# Patient Record
Sex: Male | Born: 1985 | Race: Black or African American | Hispanic: No | Marital: Married | Smoking: Never smoker
Health system: Southern US, Community
[De-identification: ages and names within clinical notes are randomized; demographics above are authoritative.]

## PROBLEM LIST (undated history)

## (undated) DIAGNOSIS — S62509A Fracture of unspecified phalanx of unspecified thumb, initial encounter for closed fracture: Secondary | ICD-10-CM

## (undated) DIAGNOSIS — D72819 Decreased white blood cell count, unspecified: Secondary | ICD-10-CM

## (undated) DIAGNOSIS — R030 Elevated blood-pressure reading, without diagnosis of hypertension: Secondary | ICD-10-CM

## (undated) DIAGNOSIS — T7840XA Allergy, unspecified, initial encounter: Secondary | ICD-10-CM

## (undated) DIAGNOSIS — E559 Vitamin D deficiency, unspecified: Secondary | ICD-10-CM

## (undated) DIAGNOSIS — L819 Disorder of pigmentation, unspecified: Secondary | ICD-10-CM

## (undated) DIAGNOSIS — F419 Anxiety disorder, unspecified: Secondary | ICD-10-CM

## (undated) DIAGNOSIS — E785 Hyperlipidemia, unspecified: Secondary | ICD-10-CM

## (undated) DIAGNOSIS — R519 Headache, unspecified: Secondary | ICD-10-CM

## (undated) DIAGNOSIS — G4700 Insomnia, unspecified: Secondary | ICD-10-CM

## (undated) DIAGNOSIS — D729 Disorder of white blood cells, unspecified: Secondary | ICD-10-CM

## (undated) DIAGNOSIS — J45909 Unspecified asthma, uncomplicated: Secondary | ICD-10-CM

## (undated) DIAGNOSIS — J849 Interstitial pulmonary disease, unspecified: Secondary | ICD-10-CM

## (undated) DIAGNOSIS — G43909 Migraine, unspecified, not intractable, without status migrainosus: Secondary | ICD-10-CM

## (undated) DIAGNOSIS — R51 Headache: Secondary | ICD-10-CM

## (undated) HISTORY — DX: Unspecified asthma, uncomplicated: J45.909

## (undated) HISTORY — DX: Migraine, unspecified, not intractable, without status migrainosus: G43.909

## (undated) HISTORY — DX: Anxiety disorder, unspecified: F41.9

## (undated) HISTORY — DX: Disorder of white blood cells, unspecified: D72.9

## (undated) HISTORY — DX: Hyperlipidemia, unspecified: E78.5

## (undated) HISTORY — DX: Interstitial pulmonary disease, unspecified: J84.9

## (undated) HISTORY — DX: Allergy, unspecified, initial encounter: T78.40XA

## (undated) HISTORY — DX: Decreased white blood cell count, unspecified: D72.819

## (undated) HISTORY — DX: Fracture of unspecified phalanx of unspecified thumb, initial encounter for closed fracture: S62.509A

## (undated) HISTORY — PX: OTHER SURGICAL HISTORY: SHX169

## (undated) HISTORY — DX: Disorder of pigmentation, unspecified: L81.9

## (undated) HISTORY — DX: Insomnia, unspecified: G47.00

## (undated) HISTORY — DX: Elevated blood-pressure reading, without diagnosis of hypertension: R03.0

## (undated) HISTORY — DX: Vitamin D deficiency, unspecified: E55.9

## (undated) HISTORY — DX: Headache, unspecified: R51.9

## (undated) HISTORY — DX: Headache: R51

---
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
## 2007-01-06 ENCOUNTER — Ambulatory Visit: Payer: Self-pay | Admitting: Family Medicine

## 2007-01-06 DIAGNOSIS — R030 Elevated blood-pressure reading, without diagnosis of hypertension: Secondary | ICD-10-CM | POA: Insufficient documentation

## 2007-01-06 DIAGNOSIS — J309 Allergic rhinitis, unspecified: Secondary | ICD-10-CM | POA: Insufficient documentation

## 2007-01-14 ENCOUNTER — Encounter (INDEPENDENT_AMBULATORY_CARE_PROVIDER_SITE_OTHER): Payer: Self-pay | Admitting: Family Medicine

## 2007-04-28 ENCOUNTER — Ambulatory Visit: Payer: Self-pay | Admitting: Family Medicine

## 2007-04-28 ENCOUNTER — Telehealth (INDEPENDENT_AMBULATORY_CARE_PROVIDER_SITE_OTHER): Payer: Self-pay | Admitting: *Deleted

## 2007-04-28 ENCOUNTER — Encounter: Payer: Self-pay | Admitting: Family Medicine

## 2007-04-28 DIAGNOSIS — J029 Acute pharyngitis, unspecified: Secondary | ICD-10-CM | POA: Insufficient documentation

## 2007-04-28 LAB — CONVERTED CEMR LAB: Rapid Strep: NEGATIVE

## 2007-09-19 ENCOUNTER — Emergency Department (HOSPITAL_COMMUNITY): Admission: EM | Admit: 2007-09-19 | Discharge: 2007-09-19 | Payer: Self-pay | Admitting: Emergency Medicine

## 2008-05-21 ENCOUNTER — Encounter (INDEPENDENT_AMBULATORY_CARE_PROVIDER_SITE_OTHER): Payer: Self-pay | Admitting: Family Medicine

## 2008-05-26 ENCOUNTER — Encounter (INDEPENDENT_AMBULATORY_CARE_PROVIDER_SITE_OTHER): Payer: Self-pay | Admitting: Family Medicine

## 2008-06-21 ENCOUNTER — Emergency Department (HOSPITAL_COMMUNITY): Admission: EM | Admit: 2008-06-21 | Discharge: 2008-06-21 | Payer: Self-pay | Admitting: Emergency Medicine

## 2010-10-15 ENCOUNTER — Encounter: Payer: Self-pay | Admitting: Family Medicine

## 2011-02-09 NOTE — Consult Note (Signed)
NAME:  Paul Silva, Paul Silva                          ACCOUNT NO.:  [id number]   MEDICAL RECORD NO.:  [id number]                   PATIENT TYPE:  EMS   LOCATION:  [facility]                                 FACILITY:  [facility]   PHYSICIAN:  Karol T. Lazarus Salines, M.D.              DATE OF BIRTH:  1986-03-31   DATE OF CONSULTATION:  DATE OF DISCHARGE:  11/20/2003                                   CONSULTATION   CHIEF COMPLAINT:  Left sore throat.   HISTORY OF PRESENT ILLNESS:  A 25 year old black male who came down with a  mild sore throat and what he thought was some left-sided earache four to  five days ago.  Yesterday, it became substantially worse with pain with  swallowing, pain in his neck, pain in his ear.  No muffled hearing or  draining from the ear.  No trismus or breathing difficulty.  He has been  able to drink at least some fluids.  He has not had recurrent problems in  the past from either tonsillitis or strep throat.  He is not diabetic or  otherwise immune compromised.  He has had no treatments thus far.   PAST MEDICAL HISTORY:  No known allergies.  No current active medical  conditions.   SOCIAL HISTORY:  He is in high school.   FAMILY HISTORY:  Noncontributory.   REVIEW OF SYSTEMS:  Normocephalic.   PHYSICAL EXAMINATION:  GENERAL:  This is a robust, adolescent black male,  moderate ______ but no significant hot potato voice.  He is not warm to  touch.  MENTAL STATUS:  Seems appropriate.  HEENT:  Ears are clear.  Anterior nose is clear.  Oral cavity is moist and  pink and in good repair.  He has erythematous bulging of the left tonsil and  soft palate which is indurated and tender.  No active drainage or exudate is  noted.  Soft palate is otherwise normal.  Did not examine nasopharynx or  hypopharynx.  NECK:  With some tender, indistinct adenopathy in the left jugulodigastric  region.   IMPRESSION:  Left peritonsillar abscess, early.   PLAN:  I discussed this with the patient  and the family.  Informed consent  was obtained.  I began with 2 mg of IV morphine and 2 mg of IV Versed slow  push. Cetacaine topical spray.  I infiltrated 10 cc of 1% Xylocaine with  1:100,000 epinephrine using a 22 gauge spinal needle in stages.  Finally, a  2 cm crescent incision was made over the superior pole of the left tonsil  and carried down to a mid posterior abscess pocket which was more widely  opened with a tonsil hemostat, and 3-4 cc of foul-smelling frank pus was  evacuated.  The patient is fully awake.  We will have him gargle with saline  and peroxide to clear out the material.  I have given him prescriptions for  amoxicillin  liquid for three days, 1 gram t.i.d. followed by amoxicillin pills 500 mg  t.i.d. for seven days further.  Also, Tylenol with codeine liquid for pain  relief.  I will see him back in my office 7-10 days in [place] as this  is more convenient for them, if he is doing nicely; sooner as needed.  The  patient and family understand and agree.                                               Gloris Manchester. Lazarus Salines, M.D.    KTW/MEDQ  D:  11/20/2003  T:  11/20/2003  Job:  [id number]   cc:   Nicoletta Dress. Colon Branch, M.D.  [address] [place]  [place] [address]  Fax: [phone]

## 2011-06-25 LAB — STREP A DNA PROBE: Group A Strep Probe: NEGATIVE

## 2011-06-25 LAB — RAPID STREP SCREEN (MED CTR MEBANE ONLY): Streptococcus, Group A Screen (Direct): NEGATIVE

## 2015-02-15 ENCOUNTER — Other Ambulatory Visit: Payer: Self-pay | Admitting: Medical

## 2015-02-15 DIAGNOSIS — R1900 Intra-abdominal and pelvic swelling, mass and lump, unspecified site: Secondary | ICD-10-CM

## 2015-02-22 ENCOUNTER — Ambulatory Visit: Payer: BLUE CROSS/BLUE SHIELD

## 2015-02-24 ENCOUNTER — Ambulatory Visit
Admission: RE | Admit: 2015-02-24 | Discharge: 2015-02-24 | Disposition: A | Discharge status: Home / Self Care | Payer: BLUE CROSS/BLUE SHIELD | Source: Ambulatory Visit | Attending: Medical | Admitting: Medical

## 2015-02-24 DIAGNOSIS — R1905 Periumbilic swelling, mass or lump: Secondary | ICD-10-CM | POA: Diagnosis present

## 2015-02-24 DIAGNOSIS — K439 Ventral hernia without obstruction or gangrene: Secondary | ICD-10-CM | POA: Insufficient documentation

## 2015-02-24 DIAGNOSIS — R1900 Intra-abdominal and pelvic swelling, mass and lump, unspecified site: Secondary | ICD-10-CM

## 2015-02-28 ENCOUNTER — Encounter: Payer: Self-pay | Admitting: *Deleted

## 2015-03-09 ENCOUNTER — Ambulatory Visit (INDEPENDENT_AMBULATORY_CARE_PROVIDER_SITE_OTHER): Payer: BLUE CROSS/BLUE SHIELD | Admitting: General Surgery

## 2015-03-09 ENCOUNTER — Encounter: Payer: Self-pay | Admitting: General Surgery

## 2015-03-09 VITALS — BP 142/82 | HR 58 | Resp 12 | Ht 71.0 in | Wt 288.0 lb

## 2015-03-09 DIAGNOSIS — K429 Umbilical hernia without obstruction or gangrene: Secondary | ICD-10-CM

## 2015-03-09 NOTE — Progress Notes (Signed)
Patient ID: Paul Silva, male   DOB: [birth year], 29 y.o.   MRN: [id number]  Chief Complaint  Patient presents with  . Umbilical Hernia    HPI Paul Silva is a 28 y.o. male here today for a evaluation of a umbilical hernia. He states he has noticed it for about one month ago. He states his work vest rides over it and irritates the area some. He does lift weights and has noticed it then as well.   HPI  Past Medical History  Diagnosis Date  . Fracture of thumb     History reviewed. No pertinent past surgical history.  History reviewed. No pertinent family history.  Social History History  Substance Use Topics  . Smoking status: Never Smoker   . Smokeless tobacco: Never Used  . Alcohol Use: No    No Known Allergies  No current outpatient prescriptions on file.   No current facility-administered medications for this visit.    Review of Systems Review of Systems  Constitutional: Negative.   Respiratory: Negative.   Cardiovascular: Negative.     Blood pressure 142/82, pulse 58, resp. rate 12, height 5\' 11"  (1.803 m), weight 288 lb (130.636 kg).  Physical Exam Physical Exam  Constitutional: He is oriented to person, place, and time. He appears well-developed and well-nourished.  Eyes: Conjunctivae are normal. No scleral icterus.  Neck: Neck supple.  Cardiovascular: Normal rate, regular rhythm and normal heart sounds.   Pulmonary/Chest: Effort normal and breath sounds normal.  Abdominal: Soft. Normal appearance. There is no tenderness. A hernia is present.    Lymphadenopathy:    He has no cervical adenopathy.  Neurological: He is alert and oriented to person, place, and time.  Skin: Skin is warm and dry.    Data Reviewed Office notes.  Assessment    Paraumbilical hernia, reducible. Minimally symptomatic.     Plan    Discussed hernia repair. Discussed procedure, risks, and benefits of surgery. Discussed possible use of mesh. Patient  agreeable.  Patient will notify the office when he desires to proceed with surgery.      PCP:  Renea Ee 03/10/2015, 8:49 AM

## 2015-03-09 NOTE — Patient Instructions (Addendum)
The patient is aware to call back for any questions or concerns. Hernia Repair with Laparoscope A hernia occurs when an internal organ pushes out through a weak spot in the belly (abdominal) wall muscles. Hernias most commonly occur in the groin and around the navel. Hernias can also occur through a cut by the surgeon (incision) after an abdominal operation. A hernia may be caused by:  Lifting heavy objects.  Prolonged coughing.  Straining to move your bowels. Hernias can often be pushed back into place (reduced). Most hernias tend to get worse over time. Problems occur when abdominal contents get stuck in the opening and the blood supply is blocked or impaired (incarcerated hernia). Because of these risks, you require surgery to repair the hernia. Your hernia will be repaired using a laparoscope. Laparoscopic surgery is a type of minimally invasive surgery. It does not involve making a typical surgical cut (incision) in the skin. A laparoscope is a telescope-like rod and lens system. It is usually connected to a video camera and a light source so your caregiver can clearly see the operative area. The instruments are inserted through  to  inch (5 mm or 10 mm) openings in the skin at specific locations. A working and viewing space is created by blowing a small amount of carbon dioxide gas into the abdominal cavity. The abdomen is essentially blown up like a balloon (insufflated). This elevates the abdominal wall above the internal organs like a dome. The carbon dioxide gas is common to the human body and can be absorbed by tissue and removed by the respiratory system. Once the repair is completed, the small incisions will be closed with either stitches (sutures) or staples (just like a paper stapler only this staple holds the skin together). LET YOUR CAREGIVERS KNOW ABOUT:  Allergies.  Medications taken including herbs, eye drops, over the counter medications, and creams.  Use of steroids (by mouth  or creams).  Previous problems with anesthetics or Novocaine.  Possibility of pregnancy, if this applies.  History of blood clots (thrombophlebitis).  History of bleeding or blood problems.  Previous surgery.  Other health problems. BEFORE THE PROCEDURE  Laparoscopy can be done either in a hospital or out-patient clinic. You may be given a mild sedative to help you relax before the procedure. Once in the operating room, you will be given a general anesthesia to make you sleep (unless you and your caregiver choose a different anesthetic).  AFTER THE PROCEDURE  After the procedure you will be watched in a recovery area. Depending on what type of hernia was repaired, you might be admitted to the hospital or you might go home the same day. With this procedure you may have less pain and scarring. This usually results in a quicker recovery and less risk of infection. HOME CARE INSTRUCTIONS   Bed rest is not required. You may continue your normal activities but avoid heavy lifting (more than 10 pounds) or straining.  Cough gently. If you are a smoker it is best to stop, as even the best hernia repair can break down with the continual strain of coughing.  Avoid driving until given the OK by your surgeon.  There are no dietary restrictions unless given otherwise.  TAKE ALL MEDICATIONS AS DIRECTED.  Only take over-the-counter or prescription medicines for pain, discomfort, or fever as directed by your caregiver. SEEK MEDICAL CARE IF:   There is increasing abdominal pain or pain in your incisions.  There is more bleeding from incisions, other  than minimal spotting.  You feel light headed or faint.  You develop an unexplained fever, chills, and/or an oral temperature above 102 F (38.9 C).  You have redness, swelling, or increasing pain in the wound.  Pus coming from wound.  A foul smell coming from the wound or dressings. SEEK IMMEDIATE MEDICAL CARE IF:   You develop a  rash.  You have difficulty breathing.  You have any allergic problems. MAKE SURE YOU:   Understand these instructions.  Will watch your condition.  Will get help right away if you are not doing well or get worse. Document Released: 09/10/2005 Document Revised: 12/03/2011 Document Reviewed: 08/10/2009 [facility] Patient Information 2015 [place], [place]. This information is not intended to replace advice given to you by your health care provider. Make sure you discuss any questions you have with your health care provider.  Patient will notify the office when he desires to proceed with surgery.

## 2015-03-10 ENCOUNTER — Encounter: Payer: Self-pay | Admitting: General Surgery

## 2015-08-08 IMAGING — US US ABDOMEN LIMITED
1 series · 14 of 25 positions shown · non-contrast
Comparison: None.

CLINICAL DATA: Three weeks of palpable mass above the umbilicus.

EXAM:
LIMITED ABDOMINAL ULTRASOUND

[Series 1: us abdomen limited · 0.30mm/px · 31 acquisitions, 14 frames shown]
[im 1/31]
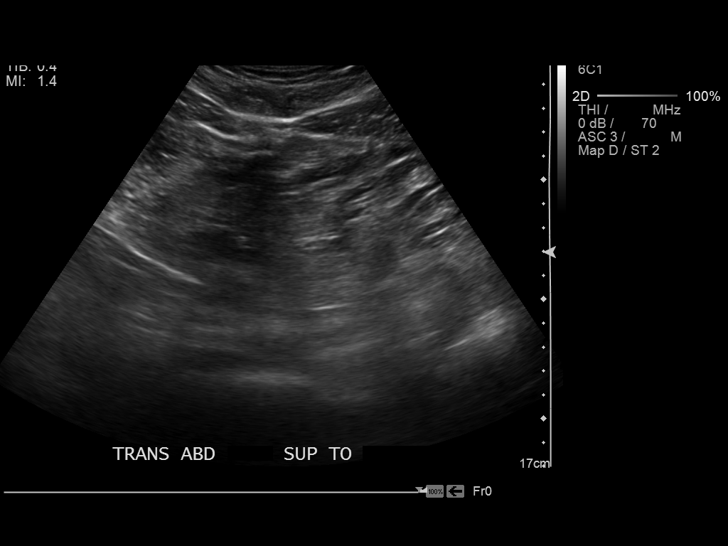
[im 3/31]
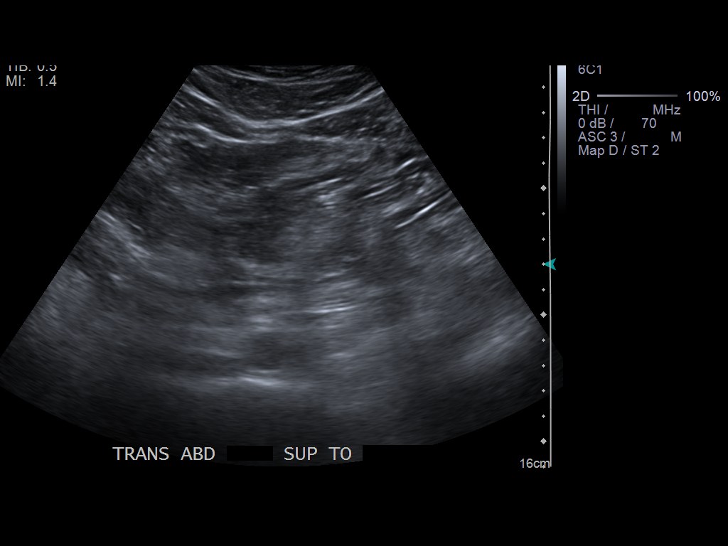
[im 6/31]
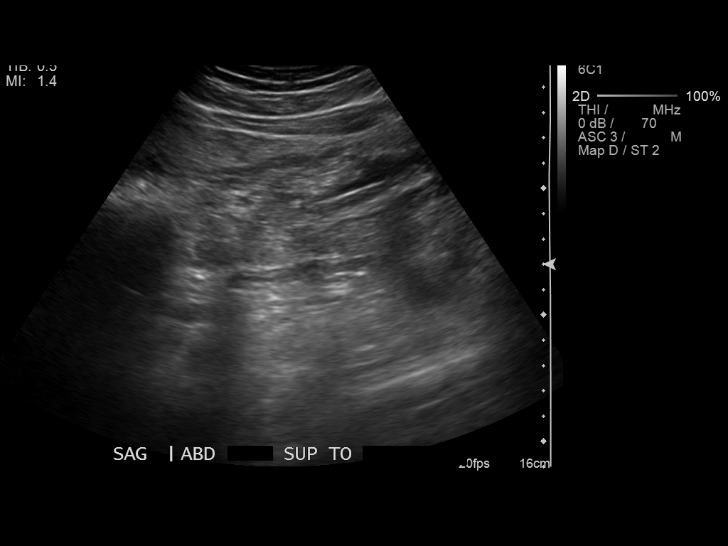
[im 8/31]
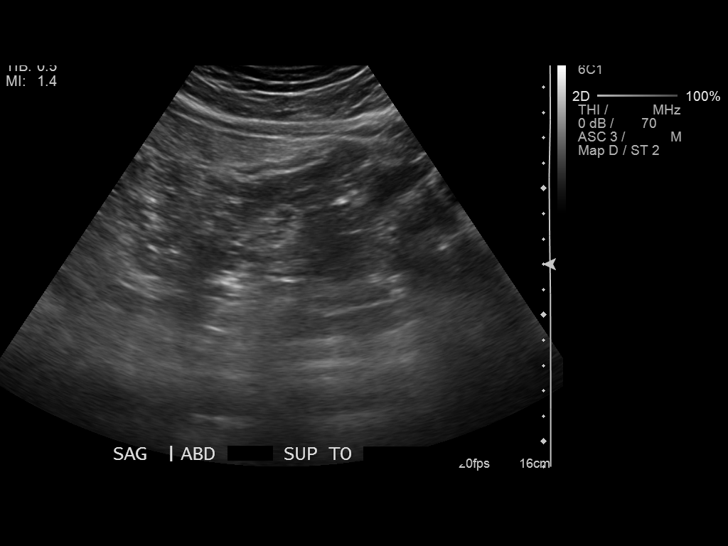
[im 11/31]
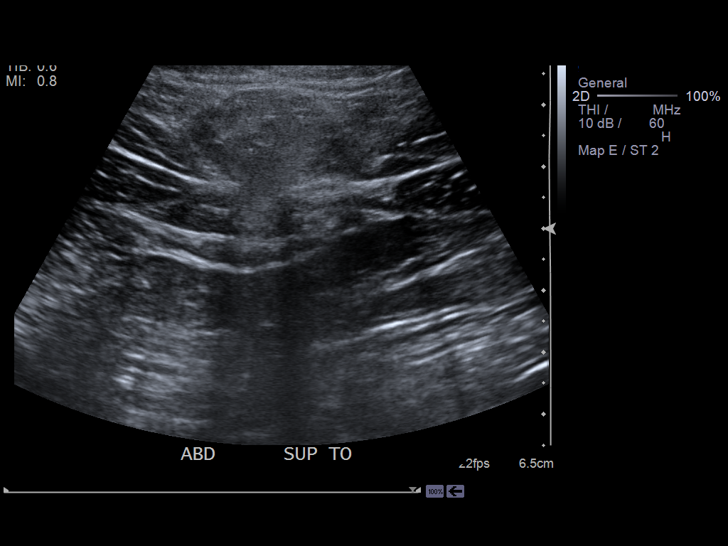
[im 12/31]
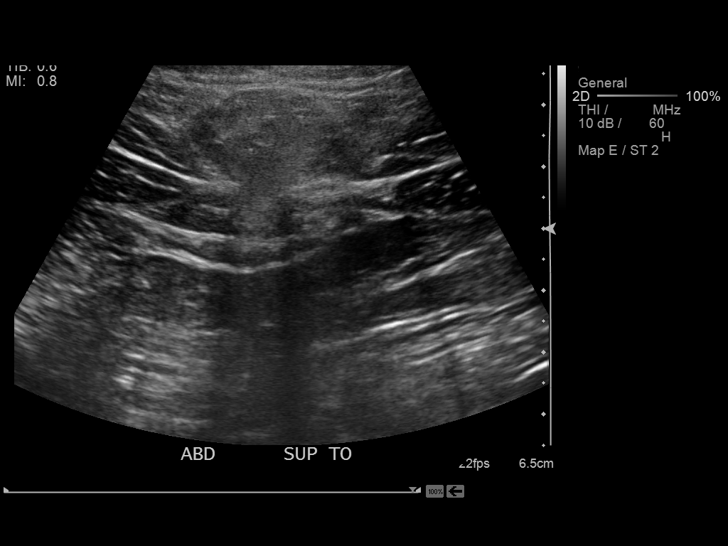
[im 14/31]
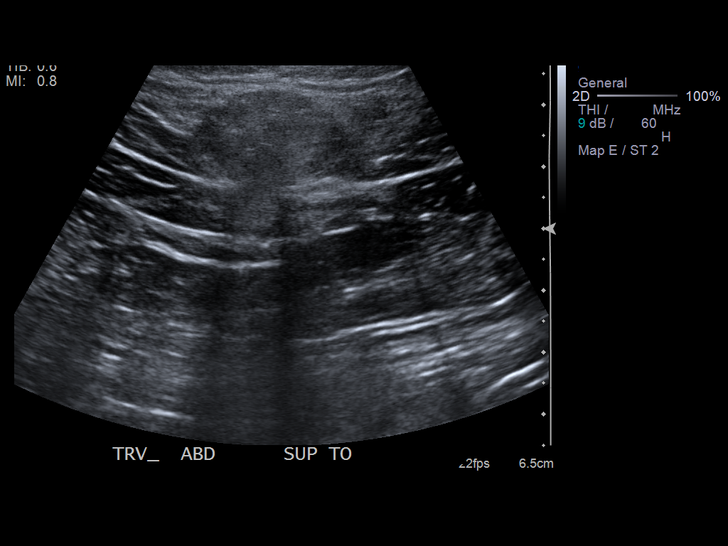
[im 17/31]
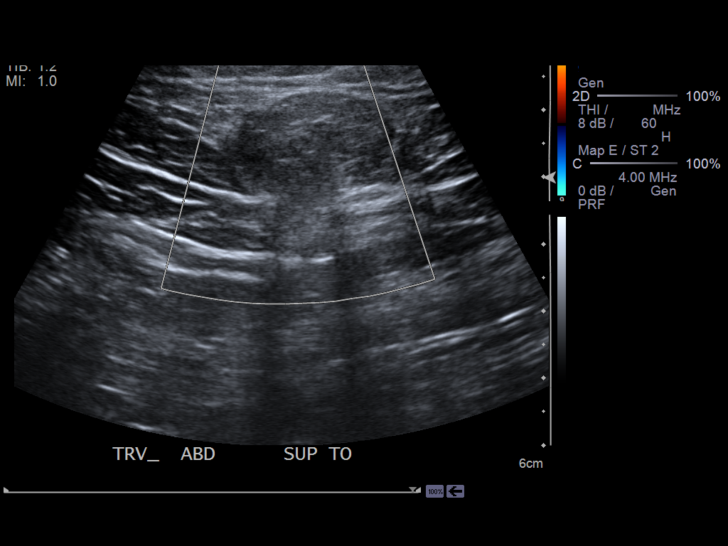
[im 19/31]
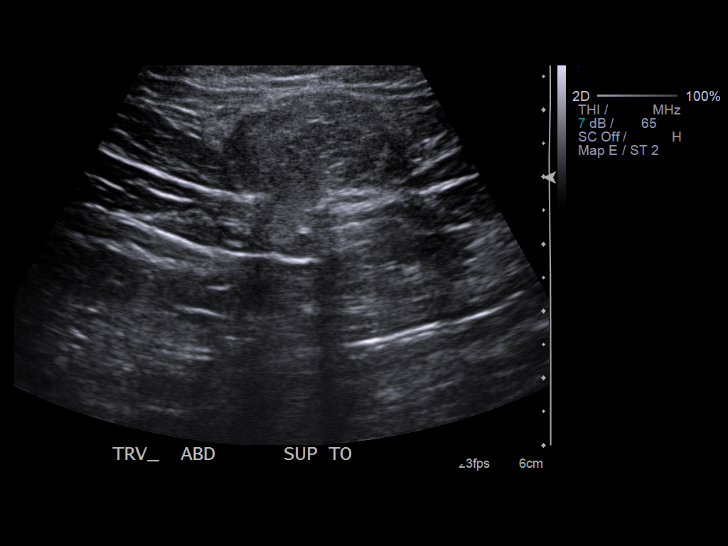
[im 21/31]
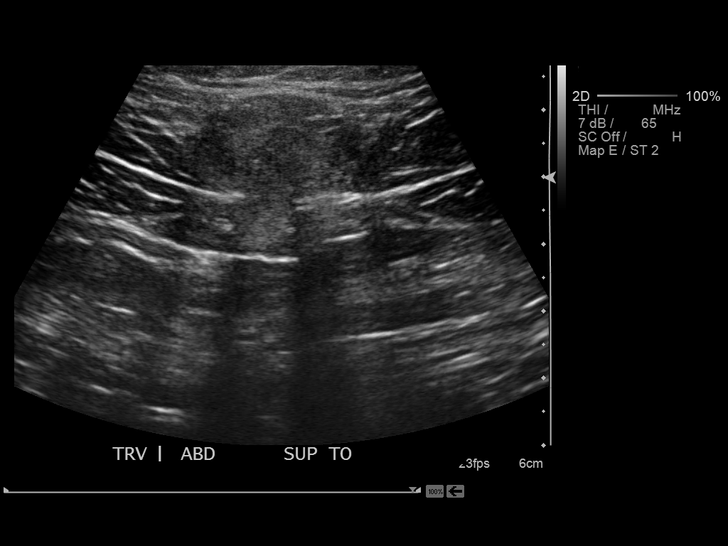
[im 23/31]
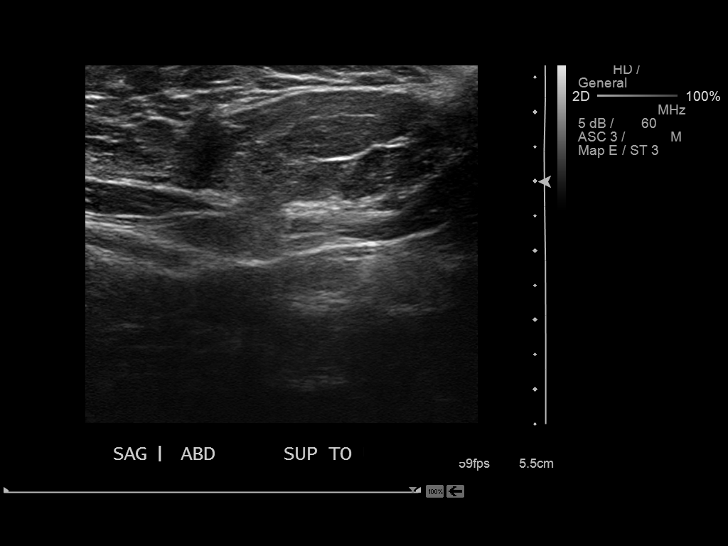
[im 26/31]
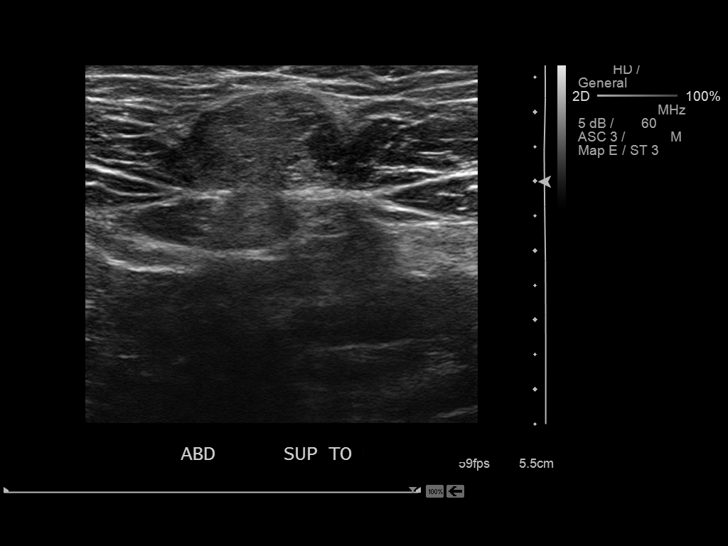
[im 28/31]
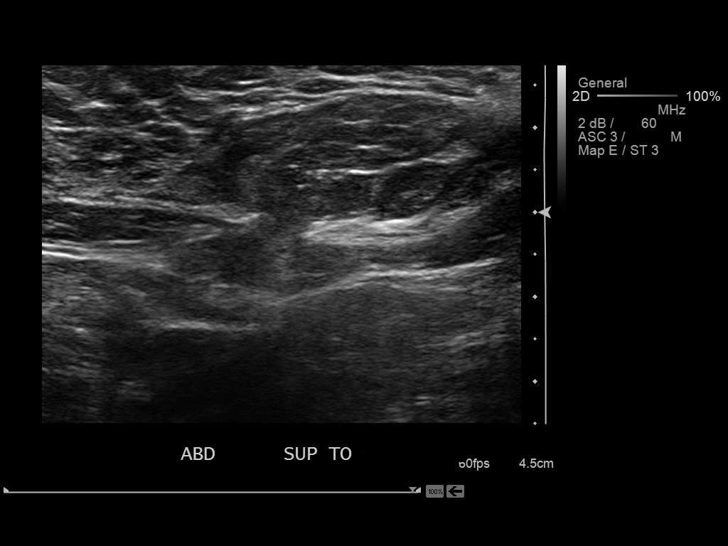
[im 31/31]
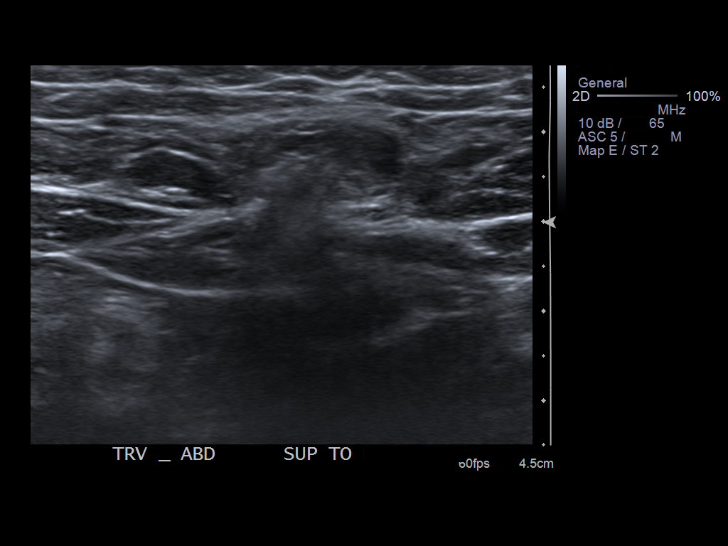

[14 of 25 positions shown; findings below may reference images not displayed]

FINDINGS: These soft tissues of the abdominal wall were evaluated above the
emboli kiss in the area of clinical concern. In this region there
appears to be a defect in the deep fascia through which omental has
entered. A small loop of peristalsing but nondistended bowel is
present within it.
IMPRESSION: There is a small supraumbilical abdominal wall hernia containing
omental fat and bowel.

## 2017-04-18 ENCOUNTER — Ambulatory Visit: Payer: BLUE CROSS/BLUE SHIELD | Admitting: Family Medicine

## 2017-04-30 ENCOUNTER — Ambulatory Visit (INDEPENDENT_AMBULATORY_CARE_PROVIDER_SITE_OTHER): Payer: Managed Care, Other (non HMO) | Admitting: Family

## 2017-04-30 ENCOUNTER — Ambulatory Visit (INDEPENDENT_AMBULATORY_CARE_PROVIDER_SITE_OTHER): Payer: Managed Care, Other (non HMO)

## 2017-04-30 ENCOUNTER — Ambulatory Visit: Payer: BLUE CROSS/BLUE SHIELD | Admitting: Family Medicine

## 2017-04-30 ENCOUNTER — Encounter: Payer: Self-pay | Admitting: Family

## 2017-04-30 VITALS — BP 128/88 | HR 67 | Temp 98.4°F | Ht 71.25 in | Wt 264.0 lb

## 2017-04-30 DIAGNOSIS — G4700 Insomnia, unspecified: Secondary | ICD-10-CM | POA: Diagnosis not present

## 2017-04-30 DIAGNOSIS — R05 Cough: Secondary | ICD-10-CM

## 2017-04-30 DIAGNOSIS — R059 Cough, unspecified: Secondary | ICD-10-CM | POA: Insufficient documentation

## 2017-04-30 MED ORDER — TRAZODONE HCL 50 MG PO TABS: 50.0000 mg | ORAL_TABLET | Freq: Every day | ORAL | 1 refills | Status: DC

## 2017-04-30 MED ORDER — BUDESONIDE-FORMOTEROL FUMARATE 80-4.5 MCG/ACT IN AERO: 2.0000 | INHALATION_SPRAY | Freq: Two times a day (BID) | RESPIRATORY_TRACT | 3 refills | Status: DC

## 2017-04-30 NOTE — Progress Notes (Signed)
Pre visit review using our clinic review tool, if applicable. No additional management support is needed unless otherwise documented below in the visit note. 

## 2017-04-30 NOTE — Patient Instructions (Addendum)
Trial steriod inhaler- suspect asthma  Trial zantac over twice a day.  Trial of trazodone  Follow up in 2 months

## 2017-04-30 NOTE — Progress Notes (Signed)
Subjective:    Patient ID: Paul Silva, male    DOB: [birth year], 31 y.o.   MRN: [id number]  CC: Paul Silva is a 31 y.o. male who presents today to establish care.    HPI: Trouble sleeping- averages 3 hours per night, takes a nap. works rotating shift. No anxiety or depression.  Excercises daily. 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 Tried melatonin, zquil, benadryl with no relief.   Asthma- dry cough x 10 months,  told he had 'asthma' in the past 10 months. 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 No problems with exercise. Tried inhaler. No wheezing, sob, cp. Tried claritin, zyrtec didn't help cough.  No eczema, drug/food allergies. Brother had asthma. Non smoker.   No epigastric burning, belching, foul taste in mouth.   Complains of heat rash from time to time from body armour for work and would like work note.           HISTORY:  Past Medical History:  Diagnosis Date  . Asthma   . Fracture of thumb   . Frequent headaches   . Migraines    History reviewed. No pertinent surgical history. Family History  Problem Relation Age of Onset  . Hypertension Father   . Diabetes Father     Allergies: Patient has no known allergies. No current outpatient prescriptions on file prior to visit.   No current facility-administered medications on file prior to visit.     Social History  Substance Use Topics  . Smoking status: Never Smoker  . Smokeless tobacco: Never Used  . Alcohol use 0.0 oz/week    Review of Systems  Constitutional: Negative for chills and fever.  HENT: Negative for congestion, ear pain, rhinorrhea, sinus pressure and sore throat.   Respiratory: Positive for cough. Negative for shortness of breath and wheezing.   Cardiovascular: Negative for chest pain and palpitations.  Gastrointestinal: Negative for diarrhea, nausea and vomiting.  Genitourinary: Negative for dysuria.  Musculoskeletal:  Negative for myalgias.  Skin: Negative for rash.  Neurological: Negative for headaches.  Hematological: Negative for adenopathy.  Psychiatric/Behavioral: Positive for sleep disturbance.      Objective:    BP 128/88   Pulse 67   Temp 98.4 F (36.9 C) (Oral)   Ht 5' 11.25" (1.81 m)   Wt 264 lb (119.7 kg)   SpO2 98%   BMI 36.56 kg/m  BP Readings from Last 3 Encounters:  04/30/17 128/88  03/09/15 (!) 142/82  04/28/07 128/87   Wt Readings from Last 3 Encounters:  04/30/17 264 lb (119.7 kg)  03/09/15 288 lb (130.6 kg)  04/28/07 203 lb (92.1 kg) (93 %, Z= 1.51)*   * Growth percentiles are based on CDC 2-20 Years data.    Physical Exam  Constitutional: He appears well-developed and well-nourished.  Cardiovascular: Regular rhythm and normal heart sounds.   Pulmonary/Chest: Effort normal and breath sounds normal. No respiratory distress. He has no wheezes. He has no rhonchi. He has no rales.  Neurological: He is alert.  Skin: Skin is warm and dry.  Psychiatric: He has a normal mood and affect. His speech is normal and behavior is normal.  Vitals reviewed.      Assessment & Plan:   Problem List Items Addressed This Visit      Other   Insomnia    Suspect shift disordered sleep as patient rotates day, night. Trial of trazodone" bedtime" (this may change  based on shifts). Follow up in 2-3 months, sooner if needed.       Relevant Medications   traZODone (DESYREL) 50 MG tablet   Cough - Primary    Afebrile today. In no acute respiratory distress. Nonlabored in speech. During office visit, several times patient had dry cough. Jointly agreed we would pursue chest x-ray to ensure no underlying pathology. Trial of Symbicort as symptoms triggered by smoke , mowing grass likely indicate asthma is underlying etiology. Alternately considering GERD, trial of zantac otc advised. Discussed formal evaluation pulmonology however we will await chest x-ray.      Relevant Orders   DG Chest  2 View (Completed)       I am having Mr. Maker start on traZODone. I am also having him maintain his PROAIR HFA.   Meds ordered this encounter  Medications  . PROAIR HFA 108 (90 Base) MCG/ACT inhaler  . traZODone (DESYREL) 50 MG tablet    Sig: Take 1 tablet (50 mg total) by mouth at bedtime.    Dispense:  90 tablet    Refill:  1    Order Specific Question:   Supervising Provider    Answer:   Deborra Medina L [[id number]]  . DISCONTD: budesonide-formoterol (SYMBICORT) 80-4.5 MCG/ACT inhaler    Sig: Inhale 2 puffs into the lungs 2 (two) times daily.    Dispense:  1 Inhaler    Refill:  3    Order Specific Question:   Supervising Provider    Answer:   Crecencio Mc [[id number]]    Return precautions given.   Risks, benefits, and alternatives of the medications and treatment plan prescribed today were discussed, and patient expressed understanding.   Education regarding symptom management and diagnosis given to patient on AVS.  Continue to follow with Burnard Hawthorne, FNP for routine health maintenance.   Paul Silva and I agreed with plan.   Mable Paris, FNP

## 2017-05-01 ENCOUNTER — Telehealth: Payer: Self-pay

## 2017-05-01 ENCOUNTER — Encounter: Payer: Self-pay | Admitting: Family

## 2017-05-01 ENCOUNTER — Telehealth: Payer: Self-pay | Admitting: Family

## 2017-05-01 DIAGNOSIS — R059 Cough, unspecified: Secondary | ICD-10-CM

## 2017-05-01 DIAGNOSIS — R05 Cough: Secondary | ICD-10-CM

## 2017-05-01 NOTE — Assessment & Plan Note (Signed)
Suspect shift disordered sleep as patient rotates day, night. Trial of trazodone" bedtime" (this may change based on shifts). Follow up in 2-3 months, sooner if needed.

## 2017-05-01 NOTE — Assessment & Plan Note (Addendum)
Afebrile today. In no acute respiratory distress. Nonlabored in speech. During office visit, several times patient had dry cough. Jointly agreed we would pursue chest x-ray to ensure no underlying pathology. Trial of Symbicort as symptoms triggered by smoke , mowing grass likely indicate asthma is underlying etiology. Alternately considering GERD, trial of zantac otc advised. Discussed formal evaluation pulmonology however we will await chest x-ray.

## 2017-05-01 NOTE — Telephone Encounter (Signed)
Patient was informed of results.  Patient understood and no questions, comments, or concerns at this time.  

## 2017-05-01 NOTE — Telephone Encounter (Signed)
Call pt-  CXR shows increased interstitial markings in lungs which I would like to be evaluated by pulmonology.  Please hold on using symbicort inhaler at this time until seen by pulmonology  We will call you with an appointment

## 2017-05-01 NOTE — Telephone Encounter (Signed)
patient called asking if we can add a date time frame on the letter we have given him for not wearing his body armour.

## 2017-05-02 NOTE — Telephone Encounter (Signed)
Patient has been informed.

## 2017-05-02 NOTE — Telephone Encounter (Signed)
Call pt-I really don't feel comfortable adding a date/time frame as there are situations for safety he would need his vest.  He will have to work out those details with his boss/manager.

## 2017-05-14 ENCOUNTER — Encounter: Payer: Self-pay | Admitting: Family

## 2017-05-14 ENCOUNTER — Encounter: Payer: Self-pay | Admitting: Internal Medicine

## 2017-05-14 ENCOUNTER — Ambulatory Visit (INDEPENDENT_AMBULATORY_CARE_PROVIDER_SITE_OTHER): Payer: Managed Care, Other (non HMO) | Admitting: Family

## 2017-05-14 ENCOUNTER — Telehealth: Payer: Self-pay | Admitting: Family

## 2017-05-14 ENCOUNTER — Ambulatory Visit (INDEPENDENT_AMBULATORY_CARE_PROVIDER_SITE_OTHER): Payer: Managed Care, Other (non HMO) | Admitting: Internal Medicine

## 2017-05-14 VITALS — BP 136/82 | HR 82 | Temp 98.6°F | Resp 14 | Wt 255.0 lb

## 2017-05-14 DIAGNOSIS — R21 Rash and other nonspecific skin eruption: Secondary | ICD-10-CM | POA: Diagnosis not present

## 2017-05-14 DIAGNOSIS — J849 Interstitial pulmonary disease, unspecified: Secondary | ICD-10-CM

## 2017-05-14 MED ORDER — TRIAMCINOLONE 0.1 % CREAM:EUCERIN CREAM 1:1: 1.0000 "application " | TOPICAL_CREAM | Freq: Two times a day (BID) | CUTANEOUS | 1 refills | Status: DC

## 2017-05-14 NOTE — Patient Instructions (Signed)
Rash consistent with eczema.  Trial of topical steriod. Use sparingly as can cause discoloration of skin  Let me know if doesn't improve   Eczema Eczema, also called atopic dermatitis, is a skin disorder that causes inflammation of the skin. It causes a red rash and dry, scaly skin. The skin becomes very itchy. Eczema is generally worse during the cooler winter months and often improves with the warmth of summer. Eczema usually starts showing signs in infancy. Some children outgrow eczema, but it may last through adulthood. What are the causes? The exact cause of eczema is not known, but it appears to run in families. People with eczema often have a family history of eczema, allergies, asthma, or hay fever. Eczema is not contagious. Flare-ups of the condition may be caused by:  Contact with something you are sensitive or allergic to.  Stress.  What are the signs or symptoms?  Dry, scaly skin.  Red, itchy rash.  Itchiness. This may occur before the skin rash and may be very intense. How is this diagnosed? The diagnosis of eczema is usually made based on symptoms and medical history. How is this treated? Eczema cannot be cured, but symptoms usually can be controlled with treatment and other strategies. A treatment plan might include:  Controlling the itching and scratching. ? Use over-the-counter antihistamines as directed for itching. This is especially useful at night when the itching tends to be worse. ? Use over-the-counter steroid creams as directed for itching. ? Avoid scratching. Scratching makes the rash and itching worse. It may also result in a skin infection (impetigo) due to a break in the skin caused by scratching.  Keeping the skin well moisturized with creams every day. This will seal in moisture and help prevent dryness. Lotions that contain alcohol and water should be avoided because they can dry the skin.  Limiting exposure to things that you are sensitive or  allergic to (allergens).  Recognizing situations that cause stress.  Developing a plan to manage stress.  Follow these instructions at home:  Only take over-the-counter or prescription medicines as directed by your health care provider.  Do not use anything on the skin without checking with your health care provider.  Keep baths or showers short (5 minutes) in warm (not hot) water. Use mild cleansers for bathing. These should be unscented. You may add nonperfumed bath oil to the bath water. It is best to avoid soap and bubble bath.  Immediately after a bath or shower, when the skin is still damp, apply a moisturizing ointment to the entire body. This ointment should be a petroleum ointment. This will seal in moisture and help prevent dryness. The thicker the ointment, the better. These should be unscented.  Keep fingernails cut short. Children with eczema may need to wear soft gloves or mittens at night after applying an ointment.  Dress in clothes made of cotton or cotton blends. Dress lightly, because heat increases itching.  A child with eczema should stay away from anyone with fever blisters or cold sores. The virus that causes fever blisters (herpes simplex) can cause a serious skin infection in children with eczema. Contact a health care provider if:  Your itching interferes with sleep.  Your rash gets worse or is not better within 1 week after starting treatment.  You see pus or soft yellow scabs in the rash area.  You have a fever.  You have a rash flare-up after contact with someone who has fever blisters. This information is  not intended to replace advice given to you by your health care provider. Make sure you discuss any questions you have with your health care provider. Document Released: 09/07/2000 Document Revised: 02/16/2016 Document Reviewed: 04/13/2013 Elsevier Interactive Patient Education  2017 [employer].

## 2017-05-14 NOTE — Progress Notes (Signed)
Pre-visit discussion using our clinic review tool. No additional management support is needed unless otherwise documented below in the visit note.  

## 2017-05-14 NOTE — Progress Notes (Signed)
[facility] Consultation      Assessment and Plan:  Chronic cough, possible asthma. -Continue Symbicort.  Mild interstitial changes on CT chest. May be indicative of mild or early sarcoidosis. -Check serum ace, metabolic panel, CT high resolution.   Date: 05/14/2017  MRN# [id number] Paul Silva 04-14-1986  Referring Physician:   DEANTRE BOURDON is a 31 y.o. old male seen in consultation for chief complaint of:    Chief Complaint  Patient presents with  . Advice Only    cough, prod at times w/clear to yellow mucus:     HPI:   He has had a cough for about a year, it was thought that it may be seasonal allergies. About 5 months ago he was given an albuterol inhaler which he used once per day with the coughing spells. The inhaler did not really help. Then went to a different doctor, a CXR was done which showed scarring.  Currently he is feeling better, he feels that the symbicort is helping. He trialed off of it, and feels that he is feeling ok.  He is able to run, exercise without dyspnea. But he still gets the coughing occasionally.  He has never smoked, his dad smoked. He works as a Editor, commissioning for [employer].   No known sarcoidosis in the family. He denies reflux or heart burn.   I personally reviewed, images, 04/30/2017. Chest x-ray; mildly increased interstitial markings, otherwise unremarkable.  PMHX:   Past Medical History:  Diagnosis Date  . Asthma   . Fracture of thumb   . Frequent headaches   . Migraines    Surgical Hx:  History reviewed. No pertinent surgical history. Family Hx:  Family History  Problem Relation Age of Onset  . Hypertension Father   . Diabetes Father    Social Hx:   Social History  Substance Use Topics  . Smoking status: Never Smoker  . Smokeless tobacco: Never Used  . Alcohol use 0.0 oz/week   Medication:    Current Outpatient Prescriptions:  .  PROAIR HFA 108 (90 Base) MCG/ACT inhaler, , Disp: , Rfl:  .   traZODone (DESYREL) 50 MG tablet, Take 1 tablet (50 mg total) by mouth at bedtime., Disp: 90 tablet, Rfl: 1 .  Triamcinolone Acetonide (TRIAMCINOLONE 0.1 % CREAM : EUCERIN) CREA, Apply 1 application topically 2 (two) times daily., Disp: 1 each, Rfl: 1   Allergies:  Patient has no known allergies.  Review of Systems: Gen:  Denies  fever, sweats, chills HEENT: Denies blurred vision, double vision. bleeds, sore throat Cvc:  No dizziness, chest pain. Resp:   Denies cough or sputum production, shortness of breath Gi: Denies swallowing difficulty, stomach pain. Gu:  Denies bladder incontinence, burning urine Ext:   No Joint pain, stiffness. Skin: No skin rash,  hives  Endoc:  No polyuria, polydipsia. Psych: No depression, insomnia. Other:  All other systems were reviewed with the patient and were negative other that what is mentioned in the HPI.   Physical Examination:   VS: BP (!) 148/88 (BP Location: Left Arm, Cuff Size: Normal)   Pulse 77   Ht 5' 11.25" (1.81 m)   Wt 255 lb (115.7 kg)   SpO2 98%   BMI 35.32 kg/m   General Appearance: No distress  Neuro:without focal findings,  speech normal,  HEENT: PERRLA, EOM intact.   Pulmonary: normal breath sounds, No wheezing.  CardiovascularNormal S1,S2.  No m/r/g.   Abdomen: Benign, Soft, non-tender. Renal:  No costovertebral  tenderness  GU:  No performed at this time. Endoc: No evident thyromegaly, no signs of acromegaly. Skin:   warm, no rashes, no ecchymosis  Extremities: normal, no cyanosis, clubbing.  Other findings:    LABORATORY PANEL:   CBC No results for input(s): WBC, HGB, HCT, PLT in the last 168 hours. ------------------------------------------------------------------------------------------------------------------  Chemistries  No results for input(s): NA, K, CL, CO2, GLUCOSE, BUN, CREATININE, CALCIUM, MG, AST, ALT, ALKPHOS, BILITOT in the last 168 hours.  Invalid input(s):  GFRCGP ------------------------------------------------------------------------------------------------------------------  Cardiac Enzymes No results for input(s): TROPONINI in the last 168 hours. ------------------------------------------------------------  RADIOLOGY:  No results found.     Thank  you for the consultation and for allowing [facility] to assist in the care of your patient. Our recommendations are noted above.  Please contact us if we can be of further service.   Marda Stalker, MD.  Board Certified in Internal Medicine, Pulmonary Medicine, [place], and Sleep Medicine.  [facility] Office Number: [phone]  Patricia Pesa, M.D.  Merton Border, M.D  05/14/2017

## 2017-05-14 NOTE — Telephone Encounter (Signed)
Pt called and stated that he just left pulmonology and they wanted to know if we could draw the labs here. Please advise, thank you!  Call pt @ [phone]

## 2017-05-14 NOTE — Assessment & Plan Note (Addendum)
Failed anti fungal. Symptoms consistent with atopic dermatitis. Will treat empirically with topical corticosteroid. Return precautions given.   Note: another note provided for work related heat rash.

## 2017-05-14 NOTE — Progress Notes (Signed)
Subjective:    Patient ID: Paul Silva, male    DOB: 1986/02/03, 31 y.o.   MRN: [id number]  CC: Paul Silva is a 31 y.o. male who presents today for follow up.   HPI: CC: rash behind right ear after going to barber shop, worsening. Tried an antifungal with some relief. Describes as scaly,  'chap skin'. This has happened before with clipped hair. Itches. No fever, chills, N, V, unusual joint pains  No h/o eczema.   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 Today states he needs another noted as would like to wear an outer carry vest ( on outside of skin) which is 'equally as protective', it has no skin to skin contact. . Heat rash continues to come and go especially when sweaty. Comes up fast and then resolves with aloe. When puts vest on, rash will appear and 'with heat.' rash didn't occur in winter, primarily summer months.    Next week has appointment with pulmonology      HISTORY:  Past Medical History:  Diagnosis Date  . Asthma   . Fracture of thumb   . Frequent headaches   . Migraines    No past surgical history on file. Family History  Problem Relation Age of Onset  . Hypertension Father   . Diabetes Father     Allergies: Patient has no known allergies. Current Outpatient Prescriptions on File Prior to Visit  Medication Sig Dispense Refill  . PROAIR HFA 108 (90 Base) MCG/ACT inhaler     . traZODone (DESYREL) 50 MG tablet Take 1 tablet (50 mg total) by mouth at bedtime. 90 tablet 1   No current facility-administered medications on file prior to visit.     Social History  Substance Use Topics  . Smoking status: Never Smoker  . Smokeless tobacco: Never Used  . Alcohol use 0.0 oz/week    Review of Systems  Constitutional: Negative for chills and fever.  Respiratory: Negative for cough.   Cardiovascular: Negative for chest pain and palpitations.  Gastrointestinal: Negative for nausea and vomiting.  Skin: Positive for  rash.      Objective:    BP 136/82 (BP Location: Left Arm, Patient Position: Sitting, Cuff Size: Normal)   Pulse 82   Temp 98.6 F (37 C) (Oral)   Resp 14   Wt 255 lb (115.7 kg)   SpO2 96%   BMI 35.32 kg/m  BP Readings from Last 3 Encounters:  05/14/17 136/82  04/30/17 128/88  03/09/15 (!) 142/82   Wt Readings from Last 3 Encounters:  05/14/17 255 lb (115.7 kg)  04/30/17 264 lb (119.7 kg)  03/09/15 288 lb (130.6 kg)    Physical Exam     Assessment & Plan:   Problem List Items Addressed This Visit      Musculoskeletal and Integument   Rash - Primary    Failed anti fungal. Symptoms consistent with atopic dermatitis. Will treat empirically with topical corticosteroid. Return precautions given.   Note: another note provided for work related heat rash.       Relevant Medications   Triamcinolone Acetonide (TRIAMCINOLONE 0.1 % CREAM : EUCERIN) CREA       I am having Mr. Hurlbut start on triamcinolone 0.1 % cream : eucerin. I am also having him maintain his PROAIR HFA, traZODone, and SYMBICORT.   Meds ordered this encounter  Medications  . SYMBICORT 80-4.5 MCG/ACT inhaler  . Triamcinolone Acetonide (TRIAMCINOLONE 0.1 %  CREAM : EUCERIN) CREA    Sig: Apply 1 application topically 2 (two) times daily.    Dispense:  1 each    Refill:  1    Order Specific Question:   Supervising Provider    Answer:   Crecencio Mc [[id number]]    Return precautions given.   Risks, benefits, and alternatives of the medications and treatment plan prescribed today were discussed, and patient expressed understanding.   Education regarding symptom management and diagnosis given to patient on AVS.  Continue to follow with Burnard Hawthorne, FNP for routine health maintenance.   Paul Silva and I agreed with plan.   Mable Paris, FNP

## 2017-05-14 NOTE — Patient Instructions (Signed)
--  Continue symbicort.  --Will check CT chest and blood ACE level.

## 2017-05-15 NOTE — Telephone Encounter (Signed)
Please advise if you are okay with patient having labs drawn here for pulmonary, thanks

## 2017-05-15 NOTE — Telephone Encounter (Signed)
Correction, one lab is order through [facility] other through [facility]. However, the provider that ordered didn't order them correctly. We are unable to release his labs here.

## 2017-05-15 NOTE — Telephone Encounter (Signed)
The labs were order for [facility]. Pt can just go to a [facility] drawing station.

## 2017-05-15 NOTE — Telephone Encounter (Signed)
Carolee Rota,  Can you help with this request?

## 2017-05-16 NOTE — Telephone Encounter (Signed)
Can you call the patient and advise that pulmonary didn't place the orders so that we could open them, so he will need to go to them to have them completed. thanks

## 2017-05-16 NOTE — Telephone Encounter (Signed)
Patient is aware and said he has a [facility] paper with the orders on it and says that he cannot do it today and that he will call us back if he wants to be put on our schedule.

## 2017-05-20 ENCOUNTER — Telehealth: Payer: Self-pay

## 2017-05-20 DIAGNOSIS — R21 Rash and other nonspecific skin eruption: Secondary | ICD-10-CM

## 2017-05-20 MED ORDER — TRIAMCINOLONE 0.1 % CREAM:EUCERIN CREAM 1:1: 1.0000 "application " | TOPICAL_CREAM | Freq: Two times a day (BID) | CUTANEOUS | 1 refills | Status: DC

## 2017-05-21 ENCOUNTER — Institutional Professional Consult (permissible substitution): Payer: Managed Care, Other (non HMO) | Admitting: Internal Medicine

## 2017-05-21 NOTE — Telephone Encounter (Signed)
Medication has been sent with correct quantity.

## 2017-05-21 NOTE — Telephone Encounter (Signed)
Pt called about the medication stating the cream does not have the quantity at the pharmacy. Please advise?    Call pt @ [phone]. Thank you!

## 2017-07-02 ENCOUNTER — Ambulatory Visit: Payer: Managed Care, Other (non HMO) | Admitting: Family

## 2018-01-15 ENCOUNTER — Ambulatory Visit (INDEPENDENT_AMBULATORY_CARE_PROVIDER_SITE_OTHER): Payer: Managed Care, Other (non HMO) | Admitting: Family

## 2018-01-15 ENCOUNTER — Encounter: Payer: Self-pay | Admitting: Family

## 2018-01-15 VITALS — BP 122/84 | HR 70 | Temp 98.6°F | Resp 16 | Wt 266.1 lb

## 2018-01-15 DIAGNOSIS — Z0000 Encounter for general adult medical examination without abnormal findings: Secondary | ICD-10-CM

## 2018-01-15 DIAGNOSIS — R05 Cough: Secondary | ICD-10-CM | POA: Diagnosis not present

## 2018-01-15 DIAGNOSIS — Z23 Encounter for immunization: Secondary | ICD-10-CM | POA: Diagnosis not present

## 2018-01-15 DIAGNOSIS — Z1389 Encounter for screening for other disorder: Secondary | ICD-10-CM | POA: Insufficient documentation

## 2018-01-15 DIAGNOSIS — R059 Cough, unspecified: Secondary | ICD-10-CM

## 2018-01-15 NOTE — Progress Notes (Signed)
Subjective:    Patient ID: Paul Silva, male    DOB: [birth year], 32 y.o.   MRN: [id number]  CC: Paul Silva is a 32 y.o. male who presents today for physical exam.    HPI: Concerned with weight gain.  Tends to loose weight around summer time, especially when cycles.  4 days per week excercises. Wife is preparing food, doing grilled chicken, sweet potatoes. Loosing belly fat. Not eating cakes, cookies. Rare soda. 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 May only eat one meal a day. Works night shift.  No history of seizure.   Dry cough for a couple of weeks, unchanged. Post nasal drip.  No wheezing, sob,  fever. Not using any medication.    Pulmonology 04/2017- never had CT chest or ACE level. Has stopped symbicort after 'yeast infection in mouth per paitent'. Using albuterol inhaler and cough had resolved for several months. Returned couple of weeks ago, suspects pollen.      Colorectal  Cancer Screening: No early family history.  Prostate Cancer Screening: Has been discussed with patient; patient declined following PSA based on risks versus benefits. No prostate cancer family history. Will start at age 37 as african [place]; discussed with Mr Schmieder today.  No testicle masses. No problelms urination or hesistancy Lung Cancer Screening: No 30 year pack year history and > 55 years.  Immunizations       Tetanus - due        HIV Screening- Candidate for , consents Labs: Screening labs today. Exercise: Gets regular exercise.  Alcohol use: None Smoking/tobacco use: Nonsmoker.  Regular dental exams: UTD Wears seat belt: Yes. Skin: no new lesions, no h/o skin cancer. Declines consult with dermatology for annual skin exam  HISTORY:  Past Medical History:  Diagnosis Date  . Asthma   . Fracture of thumb   . Frequent headaches   . Migraines     History reviewed. No pertinent surgical history. Family History  Problem Relation  Age of Onset  . Hypertension Father   . Diabetes Father   . Prostate cancer Neg Hx   . Colon cancer Neg Hx       ALLERGIES: Patient has no known allergies.  Current Outpatient Medications on File Prior to Visit  Medication Sig Dispense Refill  . PROAIR HFA 108 (90 Base) MCG/ACT inhaler      No current facility-administered medications on file prior to visit.     Social History   Tobacco Use  . Smoking status: Never Smoker  . Smokeless tobacco: Never Used  Substance Use Topics  . Alcohol use: Yes    Alcohol/week: 0.0 oz  . Drug use: No    Review of Systems  Constitutional: Negative for chills and fever.  HENT: Positive for rhinorrhea. Negative for congestion and sinus pain.   Respiratory: Positive for cough. Negative for shortness of breath and wheezing.   Cardiovascular: Negative for chest pain, palpitations and leg swelling.  Gastrointestinal: Negative for diarrhea, nausea and vomiting.  Genitourinary: Negative for difficulty urinating, scrotal swelling and testicular pain.  Musculoskeletal: Negative for myalgias.  Skin: Negative for rash.  Neurological: Negative for headaches.  Hematological: Negative for adenopathy.  Psychiatric/Behavioral: Negative for confusion.      Objective:    BP 122/84 (BP Location: Left Arm, Patient Position: Sitting, Cuff Size: Large)   Pulse 70   Temp 98.6 F (37 C) (Oral)   Resp 16   Wt  266 lb 2 oz (120.7 kg)   SpO2 96%   BMI 36.86 kg/m   BP Readings from Last 3 Encounters:  01/15/18 122/84  05/14/17 (!) 148/88  05/14/17 136/82   Wt Readings from Last 3 Encounters:  01/15/18 266 lb 2 oz (120.7 kg)  05/14/17 255 lb (115.7 kg)  05/14/17 255 lb (115.7 kg)    Physical Exam  Constitutional: Vital signs are normal. He appears well-developed and well-nourished.  HENT:  Head: Normocephalic and atraumatic.  Right Ear: Hearing, tympanic membrane, external ear and ear canal normal. No drainage, swelling or tenderness. Tympanic  membrane is not injected, not erythematous and not bulging. No middle ear effusion. No decreased hearing is noted.  Left Ear: Hearing, tympanic membrane, external ear and ear canal normal. No drainage, swelling or tenderness. Tympanic membrane is not injected, not erythematous and not bulging.  No middle ear effusion. No decreased hearing is noted.  Nose: Nose normal. Right sinus exhibits no maxillary sinus tenderness and no frontal sinus tenderness. Left sinus exhibits no maxillary sinus tenderness and no frontal sinus tenderness.  Mouth/Throat: Uvula is midline, oropharynx is clear and moist and mucous membranes are normal. No oropharyngeal exudate, posterior oropharyngeal edema, posterior oropharyngeal erythema or tonsillar abscesses.  Eyes: Conjunctivae are normal.  Neck: No thyroid mass and no thyromegaly present.  Cardiovascular: Regular rhythm and normal heart sounds.  Pulmonary/Chest: Effort normal and breath sounds normal. No respiratory distress. He has no wheezes. He has no rhonchi. He has no rales.  Lymphadenopathy:       Head (right side): No submental, no submandibular, no tonsillar, no preauricular, no posterior auricular and no occipital adenopathy present.       Head (left side): No submental, no submandibular, no tonsillar, no preauricular, no posterior auricular and no occipital adenopathy present.    He has no cervical adenopathy.    He has no axillary adenopathy.  Neurological: He is alert.  Skin: Skin is warm and dry.  Psychiatric: He has a normal mood and affect. His speech is normal and behavior is normal.  Vitals reviewed.      Assessment & Plan:   Problem List Items Addressed This Visit      Other   Cough    Recurred over the past 2 weeks. No acute respiratory distress. Not using symbicort. ? Asthma triggered from allergies.  I advised patient that he should have lab testing done with Dr. Rogelia Mire as ordered for him 04/2017.  It appears from pulmonology's note,  some suspicion of sarcoidosis.  I emphasized the patient the importance of this lab and he will call the pulmonologist office to make sure he does have this lab and possible CT Chest.  If cough is not improved with Flonase, or trial of  Zantac, patient will let me know. Will follow.       Routine physical examination - Primary    Patient declines testicular exam today.  Denies any testicular pain, swelling, masses.  I advised him to continue self testicular exams at home.  We discussed that we would start earlier prostate cancer screens for him including prostate exam, PSA when he turns 42.  Tetanus vaccine boosted.  Screening labs ordered.  Long discussion in regards to weight gain but I suspect related to dietary indiscretion.  Diet plan given to patient.  Discussed possibly using Wellbutrin at a later date if patient is able to make some dietary changes at home.  Will follow.      Relevant Orders  CBC with Differential/Platelet   Comprehensive metabolic panel   Hemoglobin A1C   HIV antibody (with reflex)   Lipid panel   TSH   VITAMIN D 25 Hydroxy (Vit-D Deficiency, Fractures)    Other Visit Diagnoses    Need for diphtheria-tetanus-pertussis (Tdap) vaccine       Relevant Orders   Tdap vaccine greater than or equal to 7yo IM (Completed)       I have discontinued Edmundo J. Schillaci's traZODone and triamcinolone 0.1 % cream : eucerin. I am also having him maintain his PROAIR HFA.   No orders of the defined types were placed in this encounter.   Return precautions given.   Risks, benefits, and alternatives of the medications and treatment plan prescribed today were discussed, and patient expressed understanding.   Education regarding symptom management and diagnosis given to patient on AVS.   Continue to follow with Burnard Hawthorne, FNP for routine health maintenance.   Paul Silva and I agreed with plan.   Mable Paris, FNP

## 2018-01-15 NOTE — Patient Instructions (Addendum)
Call Dr Ashby Dawes regarding [facility] lab and ask to be reordered since you would like to do it now.   Trial of zantac twice daily  ( refluex?) and flonase ( allergies? ) for cough.    Labs today  This is  Dr. Lupita Dawn  example of a  "Low GI"  Diet:  It will allow you to lose 4 to 8  lbs  per month if you follow it carefully.  Your goal with exercise is a minimum of 30 minutes of aerobic exercise 5 days per week (Walking does not count once it becomes easy!)    All of the foods can be found at grocery stores and in bulk at [employer].  The Atkins protein bars and shakes are available in more varieties at [employer], [employer] and [place].     7 AM Breakfast:  Choose from the following:  Low carbohydrate Protein  Shakes (I recommend the  Premier Protein chocolate shakes,  EAS AdvantEdge "Carb Control" shakes  Or the Atkins shakes all are under 3 net carbs)     a scrambled egg/bacon/cheese burrito made with Mission's "carb balance" whole wheat tortilla  (about 10 net carbs )  Regulatory affairs officer (basically a quiche without the pastry crust) that is eaten cold and very convenient way to get your eggs.  8 carbs)  If you make your own protein shakes, avoid bananas and pineapple,  And use low carb greek yogurt or original /unsweetened almond or soy milk    Avoid cereal and bananas, oatmeal and cream of wheat and grits. They are loaded with carbohydrates!   10 AM: high protein snack:  Protein bar by Atkins (the snack size, under 200 cal, usually < 6 net carbs).    A stick of cheese:  Around 1 carb,  100 cal     Dannon Light n Fit [place] Yogurt  (80 cal, 8 carbs)  Other so called "protein bars" and Greek yogurts tend to be loaded with carbohydrates.  Remember, in food advertising, the word "energy" is synonymous for " carbohydrate."  Lunch:   A Sandwich using the bread choices listed, Can use any  Eggs,  lunchmeat, grilled meat or canned tuna), avocado, regular mayo/mustard  and  cheese.  A Salad using blue cheese, ranch,  Goddess or vinagrette,  Avoid taco shells, croutons or "confetti" and no "candied nuts" but regular nuts OK.   No pretzels, nabs  or chips.  Pickles and miniature sweet peppers are a good low carb alternative that provide a "crunch"  The bread is the only source of carbohydrate in a sandwich and  can be decreased by trying some of the attached alternatives to traditional loaf bread   Avoid "Low fat dressings, as well as [place] dressings They are loaded with sugar!   3 PM/ Mid day  Snack:  Consider  1 ounce of  almonds, walnuts, pistachios, pecans, peanuts,  Macadamia nuts or a nut medley.  Avoid "granola and granola bars "  Mixed nuts are ok in moderation as long as there are no raisins,  cranberries or dried fruit.   KIND bars are OK if you get the low glycemic index variety   Try the prosciutto/mozzarella cheese sticks by [employer]  In deli /backery section   High protein      6 PM  Dinner:     Meat/fowl/fish with a green salad, and either broccoli, cauliflower, green beans, spinach, brussel sprouts or  [place]  beans. DO NOT BREAD THE PROTEIN!!      There is a low carb pasta by Dreamfield's that is acceptable and tastes great: only 5 digestible carbs/serving.( All grocery stores but BJs carry it ) Several ready made meals are available low carb:   Try Michel Angelo's chicken piccata or chicken or eggplant parm over low carb pasta.(Lowes and BJs)   Marjory Lies Sanchez's "Carnitas" (pulled pork, no sauce,  0 carbs) or his beef pot roast to make a dinner burrito (at [employer]J's)  Pesto over low carb pasta (bj's sells a good quality pesto in the center refrigerated section of the deli   Try satueeing  Cheral Marker with mushroooms as a good side   [employer] makes a mashed cauliflower that tastes like mashed potatoes  Whole wheat pasta is still full of digestible carbs and  Not as low in glycemic index as Dreamfield's.   Brown rice is still rice,   So skip the rice and noodles if you eat [place] or [place] (or at least limit to 1/2 cup)  9 PM snack :   Breyer's "low carb" fudgsicle or  ice cream bar (Carb Smart line), or  Weight Watcher's ice cream bar , or another "no sugar added" ice cream;  a serving of fresh berries/cherries with whipped cream   Cheese or DANNON'S LlGHT N FIT GREEK YOGURT  8 ounces of [place] unsweetened almond/cococunut milk    Treat yourself to a parfait made with whipped cream blueberiies, walnuts and vanilla greek yogurt  Avoid bananas, pineapple, grapes  and watermelon on a regular basis because they are high in sugar.  THINK OF THEM AS DESSERT  Remember that snack Substitutions should be less than 10 NET carbs per serving and meals < 20 carbs. Remember to subtract fiber grams to get the "net carbs."  @TULLOBREADPACKAGE @   Health Maintenance, Male A healthy lifestyle and preventive care is important for your health and wellness. Ask your health care provider about what schedule of regular examinations is right for you. What should I know about weight and diet? Eat a Healthy Diet  Eat plenty of vegetables, fruits, whole grains, low-fat dairy products, and lean protein.  Do not eat a lot of foods high in solid fats, added sugars, or salt.  Maintain a Healthy Weight Regular exercise can help you achieve or maintain a healthy weight. You should:  Do at least 150 minutes of exercise each week. The exercise should increase your heart rate and make you sweat (moderate-intensity exercise).  Do strength-training exercises at least twice a week.  Watch Your Levels of Cholesterol and Blood Lipids  Have your blood tested for lipids and cholesterol every 5 years starting at 30 years of age. If you are at high risk for heart disease, you should start having your blood tested when you are 30 years old. You may need to have your cholesterol levels checked more often if: ? Your lipid or cholesterol levels are  high. ? You are older than 30 years of age. ? You are at high risk for heart disease.  What should I know about cancer screening? Many types of cancers can be detected early and may often be prevented. Lung Cancer  You should be screened every year for lung cancer if: ? You are a current smoker who has smoked for at least 30 years. ? You are a former smoker who has quit within the past 15 years.  Talk to your health care provider about your  screening options, when you should start screening, and how often you should be screened.  Colorectal Cancer  Routine colorectal cancer screening usually begins at 30 years of age and should be repeated every 5-10 years until you are 30 years old. You may need to be screened more often if early forms of precancerous polyps or small growths are found. Your health care provider may recommend screening at an earlier age if you have risk factors for colon cancer.  Your health care provider may recommend using home test kits to check for hidden blood in the stool.  A small camera at the end of a tube can be used to examine your colon (sigmoidoscopy or colonoscopy). This checks for the earliest forms of colorectal cancer.  Prostate and Testicular Cancer  Depending on your age and overall health, your health care provider may do certain tests to screen for prostate and testicular cancer.  Talk to your health care provider about any symptoms or concerns you have about testicular or prostate cancer.  Skin Cancer  Check your skin from head to toe regularly.  Tell your health care provider about any new moles or changes in moles, especially if: ? There is a change in a mole's size, shape, or color. ? You have a mole that is larger than a pencil eraser.  Always use sunscreen. Apply sunscreen liberally and repeat throughout the day.  Protect yourself by wearing long sleeves, pants, a wide-brimmed hat, and sunglasses when outside.  What should I know  about heart disease, diabetes, and high blood pressure?  If you are 91-19 years of age, have your blood pressure checked every 3-5 years. If you are 47 years of age or older, have your blood pressure checked every year. You should have your blood pressure measured twice-once when you are at a hospital or clinic, and once when you are not at a hospital or clinic. Record the average of the two measurements. To check your blood pressure when you are not at a hospital or clinic, you can use: ? An automated blood pressure machine at a pharmacy. ? A home blood pressure monitor.  Talk to your health care provider about your target blood pressure.  If you are between 19-80 years old, ask your health care provider if you should take aspirin to prevent heart disease.  Have regular diabetes screenings by checking your fasting blood sugar level. ? If you are at a normal weight and have a low risk for diabetes, have this test once every three years after the age of 4. ? If you are overweight and have a high risk for diabetes, consider being tested at a younger age or more often.  A one-time screening for abdominal aortic aneurysm (AAA) by ultrasound is recommended for men aged 62-75 years who are current or former smokers. What should I know about preventing infection? Hepatitis B If you have a higher risk for hepatitis B, you should be screened for this virus. Talk with your health care provider to find out if you are at risk for hepatitis B infection. Hepatitis C Blood testing is recommended for:  Everyone born from [birth year] through 1963.  Anyone with known risk factors for hepatitis C.  Sexually Transmitted Diseases (STDs)  You should be screened each year for STDs including gonorrhea and chlamydia if: ? You are sexually active and are younger than 30 years of age. ? You are older than 30 years of age and your health care provider tells you that  you are at risk for this type of infection. ? Your  sexual activity has changed since you were last screened and you are at an increased risk for chlamydia or gonorrhea. Ask your health care provider if you are at risk.  Talk with your health care provider about whether you are at high risk of being infected with HIV. Your health care provider may recommend a prescription medicine to help prevent HIV infection.  What else can I do?  Schedule regular health, dental, and eye exams.  Stay current with your vaccines (immunizations).  Do not use any tobacco products, such as cigarettes, chewing tobacco, and e-cigarettes. If you need help quitting, ask your health care provider.  Limit alcohol intake to no more than 2 drinks per day. One drink equals 12 ounces of beer, 5 ounces of wine, or 1 ounces of hard liquor.  Do not use street drugs.  Do not share needles.  Ask your health care provider for help if you need support or information about quitting drugs.  Tell your health care provider if you often feel depressed.  Tell your health care provider if you have ever been abused or do not feel safe at home. This information is not intended to replace advice given to you by your health care provider. Make sure you discuss any questions you have with your health care provider. Document Released: 03/08/2008 Document Revised: 05/09/2016 Document Reviewed: 06/14/2015 Elsevier Interactive Patient Education  [employer].

## 2018-01-15 NOTE — Assessment & Plan Note (Signed)
Recurred over the past 2 weeks. No acute respiratory distress. Not using symbicort. ? Asthma triggered from allergies.  I advised patient that he should have lab testing done with Dr. Rogelia Mire as ordered for him 04/2017.  It appears from pulmonology's note, some suspicion of sarcoidosis.  I emphasized the patient the importance of this lab and he will call the pulmonologist office to make sure he does have this lab and possible CT Chest.  If cough is not improved with Flonase, or trial of  Zantac, patient will let me know. Will follow.

## 2018-01-15 NOTE — Assessment & Plan Note (Signed)
Patient declines testicular exam today.  Denies any testicular pain, swelling, masses.  I advised him to continue self testicular exams at home.  We discussed that we would start earlier prostate cancer screens for him including prostate exam, PSA when he turns 42.  Tetanus vaccine boosted.  Screening labs ordered.  Long discussion in regards to weight gain but I suspect related to dietary indiscretion.  Diet plan given to patient.  Discussed possibly using Wellbutrin at a later date if patient is able to make some dietary changes at home.  Will follow.

## 2018-01-20 ENCOUNTER — Other Ambulatory Visit (INDEPENDENT_AMBULATORY_CARE_PROVIDER_SITE_OTHER): Payer: Managed Care, Other (non HMO)

## 2018-01-20 DIAGNOSIS — Z0000 Encounter for general adult medical examination without abnormal findings: Secondary | ICD-10-CM | POA: Diagnosis not present

## 2018-01-20 LAB — CBC WITH DIFFERENTIAL/PLATELET
BASOS PCT: 0.7 % (ref 0.0–3.0)
Basophils Absolute: 0 10*3/uL (ref 0.0–0.1)
Eosinophils Absolute: 0.2 10*3/uL (ref 0.0–0.7)
Eosinophils Relative: 7.4 % — ABNORMAL HIGH (ref 0.0–5.0)
HEMATOCRIT: 40.5 % (ref 39.0–52.0)
Hemoglobin: 13.5 g/dL (ref 13.0–17.0)
LYMPHS PCT: 37.1 % (ref 12.0–46.0)
Lymphs Abs: 1.1 10*3/uL (ref 0.7–4.0)
MCHC: 33.4 g/dL (ref 30.0–36.0)
MCV: 79.2 fl (ref 78.0–100.0)
Monocytes Absolute: 0.3 10*3/uL (ref 0.1–1.0)
Monocytes Relative: 10.1 % (ref 3.0–12.0)
NEUTROS ABS: 1.3 10*3/uL — AB (ref 1.4–7.7)
Neutrophils Relative %: 44.7 % (ref 43.0–77.0)
PLATELETS: 167 10*3/uL (ref 150.0–400.0)
RBC: 5.12 Mil/uL (ref 4.22–5.81)
RDW: 13.8 % (ref 11.5–15.5)
WBC: 2.9 10*3/uL — ABNORMAL LOW (ref 4.0–10.5)

## 2018-01-20 LAB — VITAMIN D 25 HYDROXY (VIT D DEFICIENCY, FRACTURES): VITD: 17.7 ng/mL — AB (ref 30.00–100.00)

## 2018-01-20 LAB — LIPID PANEL
CHOL/HDL RATIO: 6
Cholesterol: 288 mg/dL — ABNORMAL HIGH (ref 0–200)
HDL: 47.7 mg/dL (ref >39.00)
LDL CALC: 214 mg/dL — AB (ref 0–99)
NonHDL: 240.21
Triglycerides: 132 mg/dL (ref 0.0–149.0)
VLDL: 26.4 mg/dL (ref 0.0–40.0)

## 2018-01-20 LAB — COMPREHENSIVE METABOLIC PANEL
ALT: 20 U/L (ref 0–53)
AST: 16 U/L (ref 0–37)
Albumin: 4.6 g/dL (ref 3.5–5.2)
Alkaline Phosphatase: 69 U/L (ref 39–117)
BUN: 10 mg/dL (ref 6–23)
CALCIUM: 9.9 mg/dL (ref 8.4–10.5)
CHLORIDE: 99 meq/L (ref 96–112)
CO2: 29 mEq/L (ref 19–32)
Creatinine, Ser: 1.08 mg/dL (ref 0.40–1.50)
GFR: 103.09 mL/min (ref >60.00)
Glucose, Bld: 106 mg/dL — ABNORMAL HIGH (ref 70–99)
Potassium: 3.9 mEq/L (ref 3.5–5.1)
Sodium: 137 mEq/L (ref 135–145)
Total Bilirubin: 0.6 mg/dL (ref 0.2–1.2)
Total Protein: 7.6 g/dL (ref 6.0–8.3)

## 2018-01-20 LAB — HEMOGLOBIN A1C: Hgb A1c MFr Bld: 5.9 % (ref 4.6–6.5)

## 2018-01-20 LAB — TSH: TSH: 4.16 u[IU]/mL (ref 0.35–4.50)

## 2018-01-21 LAB — HIV ANTIBODY (ROUTINE TESTING W REFLEX): HIV 1&2 Ab, 4th Generation: NONREACTIVE

## 2018-01-22 ENCOUNTER — Telehealth: Payer: Self-pay | Admitting: Family

## 2018-01-22 ENCOUNTER — Other Ambulatory Visit: Payer: Self-pay | Admitting: Family

## 2018-01-22 DIAGNOSIS — R7989 Other specified abnormal findings of blood chemistry: Secondary | ICD-10-CM

## 2018-01-22 NOTE — Telephone Encounter (Signed)
Copied from [place]. Topic: General - Other >> Jan 22, 2018 12:52 PM Yvette Rack wrote: Reason for CRM: patient calling for lab results

## 2018-01-22 NOTE — Telephone Encounter (Signed)
Called patient and notified him of his lab results and Margaret's recommendations, patient verbalized understanding. Patient has also been scheduled for lab appointment.

## 2018-01-30 ENCOUNTER — Telehealth: Payer: Self-pay | Admitting: Internal Medicine

## 2018-01-30 NOTE — Telephone Encounter (Signed)
Noted  

## 2018-01-30 NOTE — Telephone Encounter (Signed)
Called patient to schedule fu.  He is not ready for ov yet but would like to have "A stat" lab Dr. Juanell Fairly suggested he have.  Please call to advise where he should have this done.

## 2018-01-30 NOTE — Telephone Encounter (Signed)
3 attempts to schedule fu appt from recall list.   Deleting recall.   

## 2018-02-26 ENCOUNTER — Other Ambulatory Visit: Payer: Managed Care, Other (non HMO)

## 2018-05-16 ENCOUNTER — Telehealth: Payer: Self-pay | Admitting: Family

## 2018-05-16 NOTE — Telephone Encounter (Signed)
Copied from [place] [id number]. Topic: Quick Communication - See Telephone Encounter >> May 16, 2018  1:30 PM Paul Silva wrote: CRM for notification. See Telephone encounter for: 05/16/18. Paul Silva is experiencing extreme sluggishness. At this time he is requesting orders for his testosterone to be checked.  Please advise. Cb# [phone] (M)

## 2018-05-16 NOTE — Telephone Encounter (Signed)
Please advise 

## 2018-05-19 NOTE — Telephone Encounter (Signed)
Call pt He needs an appt for fatigue

## 2018-05-20 ENCOUNTER — Encounter: Payer: Self-pay | Admitting: Family Medicine

## 2018-05-20 ENCOUNTER — Ambulatory Visit (INDEPENDENT_AMBULATORY_CARE_PROVIDER_SITE_OTHER): Payer: Managed Care, Other (non HMO) | Admitting: Family Medicine

## 2018-05-20 VITALS — BP 118/90 | HR 70 | Temp 98.4°F | Wt 267.1 lb

## 2018-05-20 DIAGNOSIS — E559 Vitamin D deficiency, unspecified: Secondary | ICD-10-CM

## 2018-05-20 DIAGNOSIS — R5383 Other fatigue: Secondary | ICD-10-CM | POA: Diagnosis not present

## 2018-05-20 DIAGNOSIS — R7989 Other specified abnormal findings of blood chemistry: Secondary | ICD-10-CM

## 2018-05-20 LAB — COMPREHENSIVE METABOLIC PANEL
ALK PHOS: 63 U/L (ref 39–117)
ALT: 19 U/L (ref 0–53)
AST: 19 U/L (ref 0–37)
Albumin: 4.6 g/dL (ref 3.5–5.2)
BUN: 12 mg/dL (ref 6–23)
CO2: 28 mEq/L (ref 19–32)
Calcium: 10.1 mg/dL (ref 8.4–10.5)
Chloride: 101 mEq/L (ref 96–112)
Creatinine, Ser: 1.15 mg/dL (ref 0.40–1.50)
GFR: 95.67 mL/min (ref >60.00)
Glucose, Bld: 90 mg/dL (ref 70–99)
Potassium: 3.9 mEq/L (ref 3.5–5.1)
Sodium: 135 mEq/L (ref 135–145)
TOTAL PROTEIN: 7.8 g/dL (ref 6.0–8.3)
Total Bilirubin: 0.7 mg/dL (ref 0.2–1.2)

## 2018-05-20 LAB — B12 AND FOLATE PANEL
Folate: 20.7 ng/mL (ref >5.9)
VITAMIN B 12: 417 pg/mL (ref 211–911)

## 2018-05-20 LAB — TSH: TSH: 2.1 u[IU]/mL (ref 0.35–4.50)

## 2018-05-20 LAB — CBC
HEMATOCRIT: 41.9 % (ref 39.0–52.0)
HEMOGLOBIN: 13.9 g/dL (ref 13.0–17.0)
MCHC: 33.2 g/dL (ref 30.0–36.0)
MCV: 79 fl (ref 78.0–100.0)
Platelets: 159 10*3/uL (ref 150.0–400.0)
RBC: 5.3 Mil/uL (ref 4.22–5.81)
RDW: 13.7 % (ref 11.5–15.5)
WBC: 2.9 10*3/uL — AB (ref 4.0–10.5)

## 2018-05-20 LAB — VITAMIN D 25 HYDROXY (VIT D DEFICIENCY, FRACTURES): VITD: 23.35 ng/mL — AB (ref 30.00–100.00)

## 2018-05-20 LAB — TESTOSTERONE: Testosterone: 295.38 ng/dL — ABNORMAL LOW (ref 300.00–890.00)

## 2018-05-20 NOTE — Patient Instructions (Signed)

## 2018-05-20 NOTE — Progress Notes (Signed)
Subjective:    Patient ID: Paul Silva, male    DOB: July 10, 1986, 32 y.o.   MRN: [id number]  HPI  Presents to clinic c/o fatigue for 2 to 3 months.  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  States especially when he works nights and tries to sleep during the day he may get 4 to 5 hours of sleep total.  He has low-dose trazodone that he has used in the past as needed to help sleep, but states this does not help him to sleep any longer.  He also has 2 young children that are with him during the day when he is home from work.  He tries to exercise regularly, but states he does not exercise often as he would like.  Tries to eat a healthy and balanced diet, but notes when he is working night shift his appetite is not as much as it is when he is working the dayshift.  Patient Active Problem List   Diagnosis Date Noted  . Routine physical examination 01/15/2018  . Rash 05/14/2017  . Insomnia 04/30/2017  . Cough 04/30/2017   Social History   Tobacco Use  . Smoking status: Never Smoker  . Smokeless tobacco: Never Used  Substance Use Topics  . Alcohol use: Yes    Alcohol/week: 0.0 standard drinks    Review of Systems   Constitutional: +fatigue. Negative for chills, and fever.  HENT: Negative for congestion, ear pain, sinus pain and sore throat.   Eyes: Negative.   Respiratory: Negative for cough, shortness of breath and wheezing.   Cardiovascular: Negative for chest pain, palpitations and leg swelling.  Gastrointestinal: Negative for abdominal pain, diarrhea, nausea and vomiting.  Genitourinary: Negative for dysuria, frequency and urgency.  Musculoskeletal: Negative for arthralgias and myalgias.  Skin: Negative for color change, pallor and rash.  Neurological: Negative for syncope, light-headedness and headaches.  Psychiatric/Behavioral: The patient is not nervous/anxious.       Objective:   Physical  Exam  Constitutional: He appears well-developed and well-nourished. No distress.  Head: Normocephalic and atraumatic.  Eyes: Pupils are equal, round, and reactive to light. Conjunctivae and EOM are normal. No scleral icterus.  Neck: Normal range of motion. Neck supple. No tracheal deviation present.  Cardiovascular: Normal rate, regular rhythm and normal heart sounds.  Pulmonary/Chest: Effort normal and breath sounds normal. No respiratory distress. He has no wheezes. He has no rales.  Neurological: He is alert and oriented to person, place, and time. Gait normal  Skin: Skin is warm and dry. He is not diaphoretic. No pallor.  Psychiatric: He has a normal mood and affect. His behavior is normal. Thought content normal.   Nursing note and vitals reviewed.  Vitals:   05/20/18 0928  BP: 118/90  Pulse: 70  Temp: 98.4 F (36.9 C)  SpO2: 99%      Assessment & Plan:    Fatigue -- we will get lab work including CBC, electrolyte panel, thyroid, vitamin D, B12 and testosterone to further evaluate.  I strongly suspect fatigue is related to his alternating schedule and having to work night shifts/day shifts. Discussed strategies for good sleep hygiene.  Offered to trial increase in dose of trazodone (currently using 25 mg to 50 mg as needed) but patient declines.  States he does not use trazodone he currently has often and does not see too much of effect.   Once  we get lab results back we will decide next step in plan of care.

## 2018-05-22 MED ORDER — ERGOCALCIFEROL 50 MCG (2000 UT) PO TABS: 1.0000 | ORAL_TABLET | Freq: Every day | ORAL | 3 refills | Status: DC

## 2018-05-22 NOTE — Addendum Note (Signed)
Addended by: Philis Nettle on: 05/22/2018 12:27 PM   Modules accepted: Orders

## 2018-05-23 ENCOUNTER — Telehealth: Payer: Self-pay | Admitting: Family

## 2018-05-23 NOTE — Telephone Encounter (Signed)
Patient already seen by Ander Purpura, NP

## 2018-05-23 NOTE — Telephone Encounter (Signed)
Copied from [place] [id number]. Topic: General - Other >> May 23, 2018  9:44 AM Judyann Munson wrote: Reason for CRM: Patient is requesting a call back in regards to his victim D. Please advise .   The patient had labs drawn on 8.27.19 his vit D was shown to be low per [facility]'s instructions he was to take an over the counter supplement.

## 2018-05-27 ENCOUNTER — Telehealth: Payer: Self-pay | Admitting: Family

## 2018-05-27 NOTE — Telephone Encounter (Signed)
Copied from [place] [id number]. Topic: Quick Communication - See Telephone Encounter >> May 27, 2018 11:33 AM Blase Mess A wrote: CRM for notification. See Telephone encounter for: 05/27/18. Patient came in last last week and had labs drawn Testerone was low.  He decline Testerone injection.  He is awaiting medical advise on what to do.  He did receive a call back from a nurse stating that he needed to come in.  But he says that he just was in the office.    Please advise patient with medical advise.  Patients call back number [phone]

## 2018-05-27 NOTE — Telephone Encounter (Signed)
Patient made an appt for tomorrow morning, 05/28/18 at 8:00am.

## 2018-05-27 NOTE — Telephone Encounter (Signed)
There are risks involved also with topical rub testosterone (it can rub off & onto other people) I placed an endocrine referral. If he would like to try a topical version, I would like him to come in to discuss risks/benefits of the med

## 2018-05-27 NOTE — Telephone Encounter (Signed)
Please advise 

## 2018-05-28 ENCOUNTER — Ambulatory Visit: Payer: Managed Care, Other (non HMO) | Admitting: Family Medicine

## 2018-05-28 ENCOUNTER — Encounter: Payer: Self-pay | Admitting: Family Medicine

## 2018-05-28 VITALS — BP 124/70 | HR 74 | Temp 98.7°F | Ht 71.0 in | Wt 265.8 lb

## 2018-05-28 DIAGNOSIS — E559 Vitamin D deficiency, unspecified: Secondary | ICD-10-CM | POA: Insufficient documentation

## 2018-05-28 DIAGNOSIS — R7989 Other specified abnormal findings of blood chemistry: Secondary | ICD-10-CM | POA: Diagnosis not present

## 2018-05-28 MED ORDER — TESTOSTERONE 50 MG/5GM (1%) TD GEL: 5.0000 g | Freq: Every day | TRANSDERMAL | 2 refills | Status: DC

## 2018-05-28 NOTE — Progress Notes (Signed)
   Subjective:    Patient ID: Paul Silva, male    DOB: [birth year], 32 y.o.   MRN: [id number]  HPI   Patient presents to clinic to follow-up on recent lab work.  Labs showed testosterone level and vitamin D both to be low.  He has been started on vitamin D supplement.  He is also here today to discuss different options for testosterone replacement.  After thinking about it he would prefer not to begin injections and would like to start on a topical testosterone replacement.  Patient denies any history of blood clotting disorders.  Patient Active Problem List   Diagnosis Date Noted  . Routine physical examination 01/15/2018  . Rash 05/14/2017  . Insomnia 04/30/2017  . Cough 04/30/2017   Social History   Tobacco Use  . Smoking status: Never Smoker  . Smokeless tobacco: Never Used  Substance Use Topics  . Alcohol use: Yes    Alcohol/week: 0.0 standard drinks   Review of Systems   Constitutional: +fatigue HENT: Negative for congestion, ear pain, sinus pain and sore throat.   Eyes: Negative.   Respiratory: Negative for cough, shortness of breath and wheezing.   Cardiovascular: Negative for chest pain, palpitations and leg swelling.  Gastrointestinal: Negative for abdominal pain, diarrhea, nausea and vomiting.  Genitourinary: Negative for dysuria, frequency and urgency.  Musculoskeletal: Negative for arthralgias and myalgias.  Skin: Negative for color change, pallor and rash.  Neurological: Negative for syncope, light-headedness and headaches.  Psychiatric/Behavioral: The patient is not nervous/anxious.       Objective:   Physical Exam  Constitutional: He is oriented to person, place, and time. He appears well-developed and well-nourished. No distress.  Head: Normocephalic and atraumatic.  Eyes: Conjunctivae and EOM are normal. No scleral icterus.  Neck: Normal range of motion. Neck supple. No tracheal deviation present.  Cardiovascular: Normal rate, regular rhythm and  normal heart sounds.  Pulmonary/Chest: Effort normal and breath sounds normal. No respiratory distress. He has no wheezes. He has no rales.  Neurological: He is alert and oriented to person, place, and time. Gait normal.  Skin: Skin is warm and dry. He is not diaphoretic. No pallor.  Psychiatric: He has a normal mood and affect. His behavior is normal. Thought content normal.   Nursing note and vitals reviewed.  Vitals:   05/28/18 0803  BP: 124/70  Pulse: 74  Temp: 98.7 F (37.1 C)  SpO2: 99%       Assessment & Plan:   Low Testosterone -- patient will start on topical AndroGel.  Discussed risks of topical formulation of hormone replacement including but this can rub off onto others, advised to be sure to be careful after applying to not allow children or wife to get any of the gel rubbed off onto them.  Endocrinology referral was placed at last visit, appointment pending at this time.  We will plan to keep this referral in place for further evaluation/management of testosterone replacement.  Vitamin D deficiency - patient will take vitamin D supplement daily to help replace vitamin D.  Follow-up and 2 months for recheck of testosterone levels and vitamin D.  Advised to continue to attempt to get restful sleep even though he works a alternating dayshift/night shift schedule.

## 2018-05-29 ENCOUNTER — Telehealth: Payer: Self-pay

## 2018-05-29 ENCOUNTER — Telehealth: Payer: Self-pay | Admitting: Family

## 2018-05-29 NOTE — Telephone Encounter (Signed)
Prior authorization completed on 05-29-18 for Androgel and sent to covermy med

## 2018-05-29 NOTE — Telephone Encounter (Signed)
Copied from [place] [id number]. Topic: General - Other >> May 29, 2018 10:13 AM Lennox Solders wrote: Reason for CRM:pt is calling and [facility] did not received the testosterone rx. Please resend to [facility] in [place] on [address]

## 2018-05-29 NOTE — Telephone Encounter (Signed)
Prior authorization submitted to covermy meds for testosterone , waiting for approval.

## 2018-06-02 ENCOUNTER — Telehealth: Payer: Self-pay

## 2018-06-02 ENCOUNTER — Telehealth: Payer: Self-pay | Admitting: Family

## 2018-06-02 DIAGNOSIS — R7989 Other specified abnormal findings of blood chemistry: Secondary | ICD-10-CM

## 2018-06-02 MED ORDER — TESTOSTERONE 50 MG/5GM (1%) TD GEL: 5.0000 g | Freq: Every day | TRANSDERMAL | 2 refills | Status: DC

## 2018-06-02 NOTE — Telephone Encounter (Signed)
Copied from [place] [id number]. Topic: Quick Communication - See Telephone Encounter >> Jun 02, 2018 11:13 AM Blase Mess A wrote: CRM for notification. See Telephone encounter for: 06/02/18. Jasmine called from the [facility] the prescription for testosterone (ANDROGEL) 50 MG/5GM (1%) GEL [[id number]] was canceled out of there system for some reason.  They would like the script to be resent please.  [facility] [address] [place] [address] Phone: [phone] Fax: [phone]

## 2018-06-02 NOTE — Telephone Encounter (Unsigned)
Copied from [place]. Topic: Quick Communication - See Telephone Encounter >> Jun 02, 2018  4:52 PM Neva Seat wrote: Dr. Patsey Berthold - [phone] Ref # [id number] Please have Mable Paris to her back pier to [employer].

## 2018-06-02 NOTE — Telephone Encounter (Signed)
Patient will need a pier to pier for prior auth for testosterone.

## 2018-06-02 NOTE — Telephone Encounter (Signed)
Will fax back to [facility] at the number listed below at [phone].

## 2018-06-02 NOTE — Telephone Encounter (Signed)
Copied from [place] [id number]. Topic: Quick Communication - See Telephone Encounter >> Jun 02, 2018  2:23 PM Rutherford Nail, [place] wrote: CRM for notification. See Telephone encounter for: 06/02/18. Dr Patsey Berthold, medical director, with [facility] calling in regards to the PA on the testosterone (ANDROGEL) 50 MG/5GM (1%) GEL.  Prior Auth #: [id number] CB#: [phone]

## 2018-06-02 NOTE — Telephone Encounter (Signed)
Called and left voicemail for Dr.Gonzalez in reference to prior authorization for patient with reference number and to call the office back.

## 2018-06-03 NOTE — Telephone Encounter (Signed)
I looked at the referral just wanna know is it saying that its in the process of being scheduled.:)

## 2018-06-03 NOTE — Progress Notes (Signed)
Prior authorization for Androgel 1% Gel Packet was denied due to not meeting requirements: Total testosterone level less that 264ng/dl Total testosterone level below the labratory's normal reference range Free testosterone level below the laboratory's normal reference  And patient has no documentation of signs or symptoms of androgen deficiency.

## 2018-06-03 NOTE — Telephone Encounter (Signed)
I thought we might have a hard time getting the testosterone for him -- I did an endocrine referral also for eval by them and management. Do we know when he has endocrine appt?

## 2018-06-03 NOTE — Telephone Encounter (Signed)
Yes it looks like [facility] just "ok'd" his referral on 9/5. They will call him to schedule.

## 2018-06-05 NOTE — Telephone Encounter (Signed)
Patient called checking on the status of his testosterone (Androgel) 50mg /5gm (1%) Gel medication.  It was called in on 06/02/18.

## 2018-06-06 NOTE — Telephone Encounter (Signed)
Patient is calling to follow up on this prior authorization. He states he spoke with his insurance company and they sent a form over to be filled out. He states his insurance company would like a call back to do peer to peer.   Insurance # [phone]

## 2018-06-06 NOTE — Telephone Encounter (Signed)
See other message in regards to prior authorization for Androgel. Peer to peer to be completed by practioner

## 2018-06-09 NOTE — Telephone Encounter (Signed)
I attempted to call back for peer to peer, had to leave message and the office phone number here for the insurance to call back  LG

## 2018-06-09 NOTE — Telephone Encounter (Signed)
Pt called in to follow up, spoke with Patina and was advised. Relayed to pt and he expressed understanding. Pt says that medication cost over $200 so he cant pay out of pocket. Pt says that he will wait for a call back from Endo.

## 2018-06-17 NOTE — Telephone Encounter (Signed)
Dr. Duwayne Heck, Medical Director with [facility] calling back about Peer to Peer for pt's Testerone.  Dr. Duwayne Heck can be reached at [phone].

## 2018-06-17 NOTE — Telephone Encounter (Signed)
Peer to peer for the testosterone

## 2018-06-19 NOTE — Telephone Encounter (Signed)
OK thank you!  I have put in testoserone lab order, please call patient to schedule lab visit for this and so we can hopefully get his testosterone approved  LG

## 2018-06-19 NOTE — Telephone Encounter (Signed)
Patient has been scheduled for labs  

## 2018-06-19 NOTE — Telephone Encounter (Signed)
Dr. Duwayne Heck called for Peer to Peer on patient for testosterone, information given in chart and patient can be approved but one more morning testosterone level needs to be drawn and called to this number [phone] refer to reference number [id number].

## 2018-06-19 NOTE — Telephone Encounter (Signed)
Called and left message again. I asked if maybe they could tell the CMA the questions they are looking to have answered so we can hopefully get this medication approved instead of playing phone tag with the insurance company  Thanks  LG

## 2018-06-19 NOTE — Telephone Encounter (Signed)
Patient was called and has been scheduled for lab work to recheck testosterone levels for prior authorization approval for medication

## 2018-06-20 ENCOUNTER — Other Ambulatory Visit (INDEPENDENT_AMBULATORY_CARE_PROVIDER_SITE_OTHER): Payer: Managed Care, Other (non HMO)

## 2018-06-20 ENCOUNTER — Other Ambulatory Visit: Payer: Managed Care, Other (non HMO)

## 2018-06-20 DIAGNOSIS — R7989 Other specified abnormal findings of blood chemistry: Secondary | ICD-10-CM

## 2018-06-20 LAB — TESTOSTERONE: Testosterone: 226.73 ng/dL — ABNORMAL LOW (ref 300.00–890.00)

## 2018-06-26 ENCOUNTER — Telehealth: Payer: Self-pay | Admitting: Family

## 2018-06-26 NOTE — Telephone Encounter (Signed)
Copied from [place] [id number]. Topic: General - Other >> Jun 26, 2018  1:51 PM Yvette Rack wrote: Reason for CRM: pt calling for lab results

## 2018-06-26 NOTE — Telephone Encounter (Signed)
Patient notified or lab results and verbalized understanding

## 2018-06-30 ENCOUNTER — Telehealth: Payer: Self-pay | Admitting: Family

## 2018-06-30 NOTE — Telephone Encounter (Signed)
Called [employer] in reference to patient Prior authorization and explained that patient labs were redrawn and faxed last week , and that was the only thing that was needed to have patients androgel approved. According to their records there was not documentation of this. [place] with [facility] states that she will speak with the physician at [facility] about the matter and call me back.

## 2018-06-30 NOTE — Telephone Encounter (Signed)
Copied from [place] [id number]. Topic: Quick Communication - Rx Refill/Question >> Jun 30, 2018  9:44 AM Burchel, Abbi R wrote: Medication: testosterone (ANDROGEL) 50 MG/5GM (1%) GEL  Colletta Maryland ([employer]) calling to request PA for this medication. Colletta Maryland states pt is upset and would like this PA processed as soon as possible.    [phone]

## 2018-07-28 ENCOUNTER — Ambulatory Visit: Payer: Managed Care, Other (non HMO) | Admitting: Family

## 2018-09-01 ENCOUNTER — Telehealth: Payer: Self-pay | Admitting: *Deleted

## 2018-09-01 NOTE — Telephone Encounter (Signed)
Copied from [place] [id number]. Topic: General - Other >> Sep 01, 2018  9:58 AM Rayann Heman wrote: Reason for CRM: pt called and stated that he will need testosterone (ANDROGEL) 50 MG/5GM (1%) GEL [[id number]]  ENDED refilled and would like to know if he will need to come in for ov or just blood work. Please advise

## 2018-09-05 ENCOUNTER — Telehealth: Payer: Self-pay

## 2018-09-05 DIAGNOSIS — R7989 Other specified abnormal findings of blood chemistry: Secondary | ICD-10-CM

## 2018-09-05 MED ORDER — TESTOSTERONE 50 MG/5GM (1%) TD GEL: 5.0000 g | Freq: Every day | TRANSDERMAL | 2 refills | Status: DC

## 2018-09-05 NOTE — Telephone Encounter (Signed)
Please advise on Paul Silva's phone not from 09/01/18. Patient needs refill on testosterone & wants to know if he needs appointment first?

## 2018-09-05 NOTE — Addendum Note (Signed)
Addended by: Burnard Hawthorne on: 09/05/2018 11:32 AM   Modules accepted: Orders

## 2018-09-05 NOTE — Telephone Encounter (Signed)
Call patient I have refilled his testosterone however I would feel most comfortable if he was seen by urology as that is who typically manages testosterone replacement.  I went ahead and refilled it however I placed a referral again.  Please advise patient to give [place] a call if he is not heard regarding this appointment in the next week.

## 2018-09-05 NOTE — Telephone Encounter (Signed)
I informed patient of message & he will call back if he doesn't hear anything about referral.

## 2018-10-05 IMAGING — DX DG CHEST 2V
2 series · 2 of 2 positions shown · non-contrast
Comparison: None.

CLINICAL DATA: 31 year old male with intermittent cough since 9689.
No improvement with steroids or other medication. Denies fever or
chest pain.

EXAM:
CHEST  2 VIEW

[chest pa]
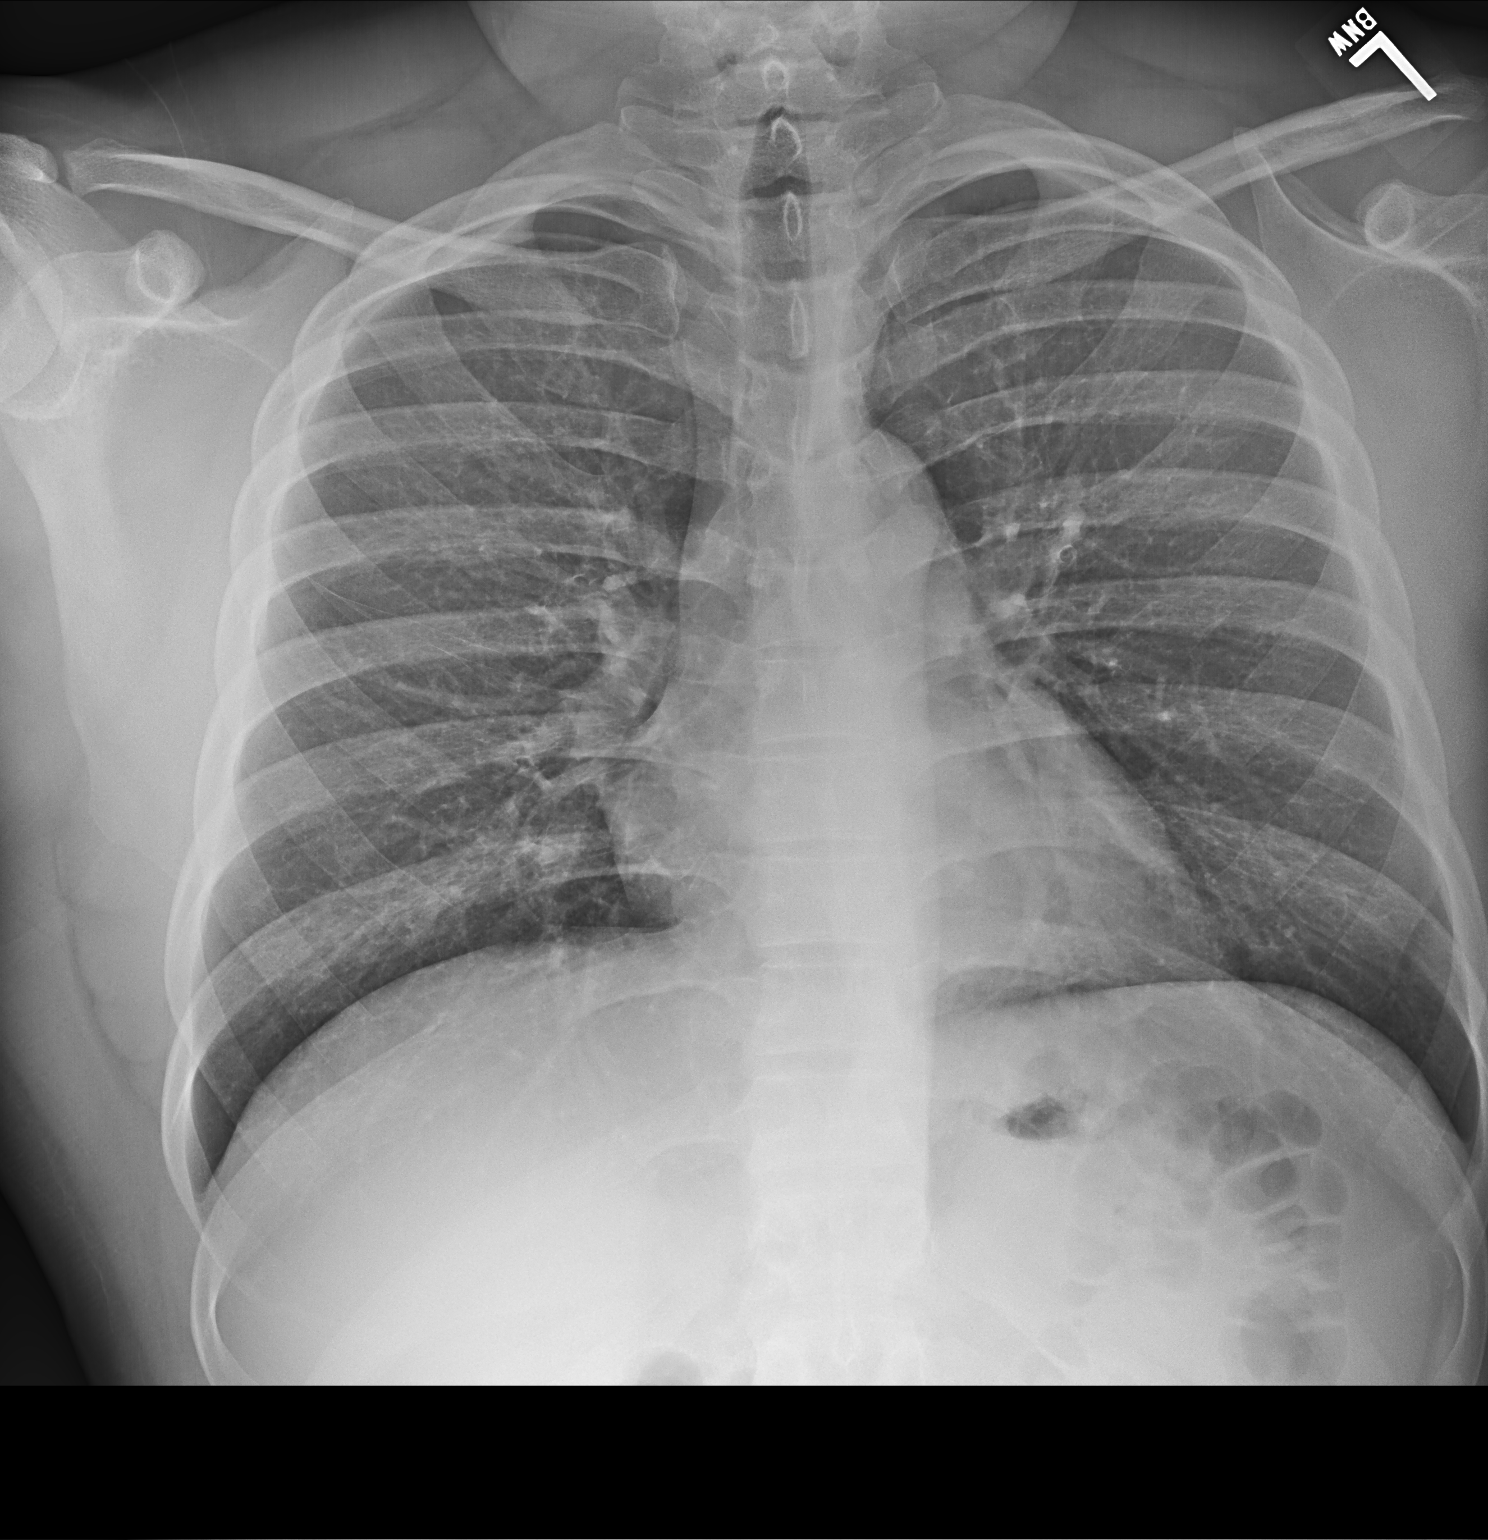

[chest lat]
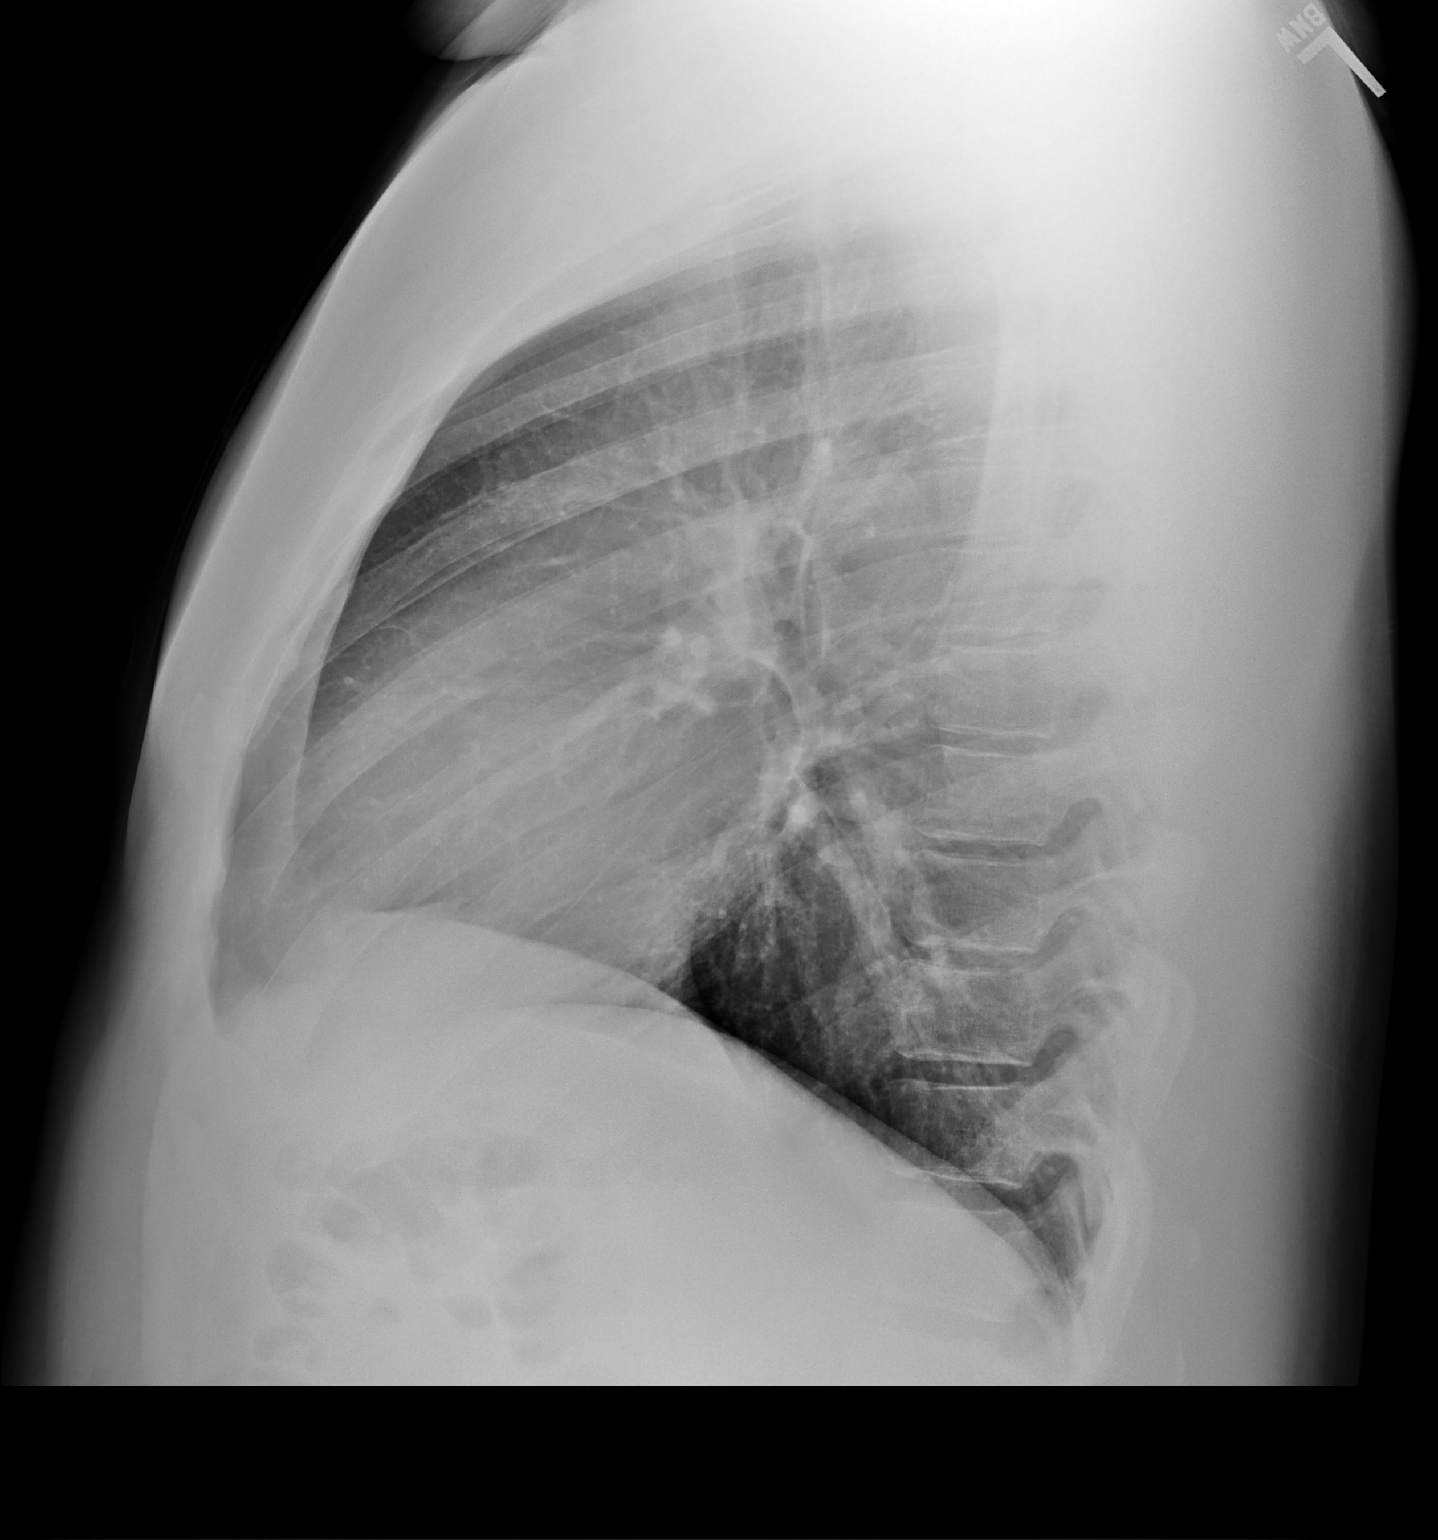

[2 of 2 positions shown; findings below may reference images not displayed]

FINDINGS: Lung volumes are normal. Normal cardiac size and mediastinal
contours. Visualized tracheal air column is within normal limits. No
pneumothorax, pleural effusion or confluent pulmonary opacity, but
suggestion of increased bilateral pulmonary interstitial markings.
No osseous abnormality identified. Negative visible bowel gas
pattern.
IMPRESSION: Suggestion of nonspecific increased pulmonary interstitial markings,
otherwise negative chest.
If the patient has not responded to empiric or conservative
treatment, consider further characterization with Chest CT, and
High-resolution Chest CT probably would be most valuable.

## 2018-10-17 ENCOUNTER — Other Ambulatory Visit: Payer: Self-pay | Admitting: Sports Medicine

## 2018-10-17 DIAGNOSIS — M25462 Effusion, left knee: Secondary | ICD-10-CM

## 2018-10-17 DIAGNOSIS — M25562 Pain in left knee: Secondary | ICD-10-CM

## 2018-10-22 ENCOUNTER — Ambulatory Visit (INDEPENDENT_AMBULATORY_CARE_PROVIDER_SITE_OTHER): Payer: Managed Care, Other (non HMO) | Admitting: Urology

## 2018-10-22 ENCOUNTER — Encounter: Payer: Self-pay | Admitting: Urology

## 2018-10-22 VITALS — BP 130/81 | HR 83 | Ht 71.0 in | Wt 275.4 lb

## 2018-10-22 DIAGNOSIS — R7989 Other specified abnormal findings of blood chemistry: Secondary | ICD-10-CM

## 2018-10-22 NOTE — Progress Notes (Signed)
10/22/2018 8:52 AM   Paul Silva October 08, 1985 [id number]  Referring provider: Burnard Hawthorne, FNP [address] [place], [place] [address]  Chief Complaint  Patient presents with  . Hypogonadism    HPI:  33 yo male who noticed fatigue. He does shift work with the police. Symptoms for several months. August 2019 testosterone was 295.  A September 2019 testosterone was 226 with a hematocrit of 42. He started Androgel 4 months ago. He is on 1 gel pack per day. He may have a little more energy. He is exercising. Libido is lower. His erections are OK. He has two kids age 89 yo and 76 yo. They may have more kids. He is studying for his masters.   Modifying factors: There are no other modifying factors  Associated signs and symptoms: There are no other associated signs and symptoms Aggravating and relieving factors: There are no other aggravating or relieving factors Severity: Moderate Duration: Persistent   PMH: Past Medical History:  Diagnosis Date  . Asthma   . Fracture of thumb   . Frequent headaches   . Migraines     Surgical History: History reviewed. No pertinent surgical history.  Home Medications:  Allergies as of 10/22/2018   No Known Allergies     Medication List       Accurate as of October 22, 2018  8:52 AM. Always use your most recent med list.        Ergocalciferol 50 MCG (2000 UT) Tabs Take 1 tablet by mouth daily.   PROAIR HFA 108 (90 Base) MCG/ACT inhaler Generic drug:  albuterol   testosterone 50 MG/5GM (1%) Gel Commonly known as:  ANDROGEL Place 5 g onto the skin daily.   traZODone 50 MG tablet Commonly known as:  DESYREL Take 50 mg by mouth at bedtime. Take 1/2 to 1 table at HS PRN       Allergies: No Known Allergies  Family History: Family History  Problem Relation Age of Onset  . Hypertension Father   . Diabetes Father   . Prostate cancer Neg Hx   . Colon cancer Neg Hx     Social History:  reports that he has  never smoked. He has never used smokeless tobacco. He reports current alcohol use. He reports that he does not use drugs.  ROS:                                        Physical Exam: BP 130/81 (BP Location: Left Arm, Patient Position: Sitting, Cuff Size: Normal)   Pulse 83   Ht 5\' 11"  (1.803 m)   Wt 124.9 kg   BMI 38.41 kg/m   Constitutional:  Alert and oriented, No acute distress. HEENT: [place] AT, moist mucus membranes.  Trachea midline, no masses. Cardiovascular: No clubbing, cyanosis, or edema. Respiratory: Normal respiratory effort, no increased work of breathing. GI: Abdomen is soft, nontender, nondistended, no abdominal masses GU: No CVA tenderness Lymph: No cervical or inguinal lymphadenopathy. Skin: No rashes, bruises or suspicious lesions. Neurologic: Grossly intact, no focal deficits, moving all 4 extremities. Psychiatric: Normal mood and affect. GU: penis circumcised and normal, scrotum normal, testicles descended bilaterally and palpably normal. Epididymides normal.   Laboratory Data: Lab Results  Component Value Date   WBC 2.9 (L) 05/20/2018   HGB 13.9 05/20/2018   HCT 41.9 05/20/2018   MCV 79.0 05/20/2018  PLT 159.0 05/20/2018    Lab Results  Component Value Date   CREATININE 1.15 05/20/2018    No results found for: PSA  Lab Results  Component Value Date   TESTOSTERONE 226.73 (L) 06/20/2018    Lab Results  Component Value Date   HGBA1C 5.9 01/20/2018    Urinalysis No results found for: COLORURINE, APPEARANCEUR, LABSPEC, PHURINE, GLUCOSEU, HGBUR, BILIRUBINUR, KETONESUR, PROTEINUR, UROBILINOGEN, NITRITE, LEUKOCYTESUR  No results found for: LABMICR, WBCUA, RBCUA, LABEPIT, MUCUS, BACTERIA   No results found for this or any previous visit. No results found for this or any previous visit. No results found for this or any previous visit. No results found for this or any previous visit. No results found for this or any previous  visit. No results found for this or any previous visit. No results found for this or any previous visit. No results found for this or any previous visit.  Assessment & Plan:    1) low T - we discussed the nature risks benefits of testosterone. We discussed the etiology of low testosterone. We discussed risk of testosterone placement such as infertility and testicular atrophy among others. This would lead to the need for lifelong replacement. We discussed FDA warnings on heart attacks, blood clots and sudden death and also with abuse of testosterone. We discussed the nature risk and benefits and alternatives such as clomiphene. We discussed in most cases testosterone replacement does not improve erectile dysfunction and this still needs to be managed. We discussed the relationship between testosterone and prostate cancer which studies have typically shown a low testosterone correlates with a higher risk of prostate cancer. All questions answered. He's not sure what his T has been on the gel. I sent T today and Hgb/Hct. He will STOP all T replacement. We will check labs in 6 weeks (bioT) and if low get some confirmatory labs (bioT, LH, prolactin). In the long run, clomiphene may be a better option for him.   No follow-ups on file.  Festus Aloe, MD  [facility] [address] [place], [place] [address] [phone]

## 2018-10-23 ENCOUNTER — Telehealth: Payer: Self-pay | Admitting: Family Medicine

## 2018-10-23 LAB — TESTOSTERONE: Testosterone: 351 ng/dL (ref 264–916)

## 2018-10-23 LAB — HEMOGLOBIN AND HEMATOCRIT, BLOOD
Hematocrit: 42.3 % (ref 37.5–51.0)
Hemoglobin: 13.9 g/dL (ref 13.0–17.7)

## 2018-10-23 NOTE — Telephone Encounter (Signed)
Patient notified and voiced understanding.

## 2018-10-23 NOTE — Telephone Encounter (Signed)
-----   Message from Festus Aloe, MD sent at 10/23/2018  9:58 AM EST ----- Let the pt know his testosterone on replacement is 351 -- which is still low normal. I think we can do better but he needs to stop all testosterone replacement for 6 weeks and check a T level in 6 weeks.   ----- Message ----- From: Kyra Manges, CMA Sent: 10/23/2018   9:40 AM EST To: Festus Aloe, MD   ----- Message ----- From: Lavone Neri Lab Results In Sent: 10/23/2018   5:39 AM EST To: Rowe [facility]

## 2018-11-05 ENCOUNTER — Ambulatory Visit: Payer: Managed Care, Other (non HMO)

## 2018-12-05 ENCOUNTER — Other Ambulatory Visit (INDEPENDENT_AMBULATORY_CARE_PROVIDER_SITE_OTHER): Payer: Managed Care, Other (non HMO)

## 2018-12-05 ENCOUNTER — Other Ambulatory Visit: Payer: Self-pay

## 2018-12-05 DIAGNOSIS — R7989 Other specified abnormal findings of blood chemistry: Secondary | ICD-10-CM

## 2018-12-06 LAB — TESTOSTERONE,FREE AND TOTAL
Testosterone, Free: 9.4 pg/mL (ref 7.2–24.0)
Testosterone: 306 ng/dL (ref 264–916)

## 2018-12-18 ENCOUNTER — Ambulatory Visit: Payer: Managed Care, Other (non HMO) | Admitting: Family Medicine

## 2018-12-18 ENCOUNTER — Ambulatory Visit: Payer: Self-pay | Admitting: *Deleted

## 2018-12-18 ENCOUNTER — Other Ambulatory Visit: Payer: Self-pay

## 2018-12-18 VITALS — BP 130/98 | HR 76 | Temp 98.1°F | Resp 18 | Ht 71.5 in | Wt 290.0 lb

## 2018-12-18 DIAGNOSIS — R030 Elevated blood-pressure reading, without diagnosis of hypertension: Secondary | ICD-10-CM | POA: Diagnosis not present

## 2018-12-18 DIAGNOSIS — R454 Irritability and anger: Secondary | ICD-10-CM | POA: Insufficient documentation

## 2018-12-18 DIAGNOSIS — G4700 Insomnia, unspecified: Secondary | ICD-10-CM

## 2018-12-18 DIAGNOSIS — F419 Anxiety disorder, unspecified: Secondary | ICD-10-CM | POA: Insufficient documentation

## 2018-12-18 DIAGNOSIS — E785 Hyperlipidemia, unspecified: Secondary | ICD-10-CM | POA: Insufficient documentation

## 2018-12-18 DIAGNOSIS — F418 Other specified anxiety disorders: Secondary | ICD-10-CM | POA: Insufficient documentation

## 2018-12-18 MED ORDER — HYDROXYZINE HCL 10 MG PO TABS: 10.0000 mg | ORAL_TABLET | Freq: Three times a day (TID) | ORAL | 1 refills | Status: DC | PRN

## 2018-12-18 MED ORDER — ESCITALOPRAM OXALATE 10 MG PO TABS: 10.0000 mg | ORAL_TABLET | Freq: Every day | ORAL | 1 refills | Status: DC

## 2018-12-18 NOTE — Telephone Encounter (Signed)
Pt seen by Philis Nettle FNP today.

## 2018-12-18 NOTE — Telephone Encounter (Signed)
Pt called stating that lately he "has not been able to sleep at night, and can't turn off"; he says that he had trazadone before but it did not help; he says that he feels "anxious and unsettled at time"; the pt says that he goes to the gym and it helps with his anxiety; he also says that he has talked to Mable Paris about this; he is also concerned about restlessness; the pt says that he feels like the insomnia is causing anxiety; recommendations made per nurse triage protocol; he would like to be seen today because he has his children on 12/19/2018; the pt normally sees Mable Paris but she has no availablility today; pt offered and accepted appointment with Philis Nettle, [place], 12/18/2018 at 0920; will route office for notification  Reason for Disposition . Symptoms interfere with sleep . Insomnia is an ongoing problem (> 2 weeks)  Answer Assessment - Initial Assessment Questions 1. CONCERN: "What happened that made you call today?"     "Can't turn off" 2. ANXIETY SYMPTOM SCREENING: "Can you describe how you have been feeling?"  (e.g., tense, restless, panicky, anxious, keyed up, trouble sleeping, trouble concentrating)    "can't turn off" 3. ONSET: "How long have you been feeling this way?"     2 months 4. RECURRENT: "Have you felt this way before?"  If yes: "What happened that time?" "What helped these feelings go away in the past?"     Yes, previously took trazadone to sleep 5. RISK OF HARM - SUICIDAL IDEATION:  "Do you ever have thoughts of hurting or killing yourself?"  (e.g., yes, no, no but preoccupation with thoughts about death)   - INTENT:  "Do you have thoughts of hurting or killing yourself right NOW?" (e.g., yes, no, N/A)   - PLAN: "Do you have a specific plan for how you would do this?" (e.g., gun, knife, overdose, no plan, N/A)    no 6. RISK OF HARM - HOMICIDAL IDEATION:  "Do you ever have thoughts of hurting or killing someone else?"  (e.g., yes, no, no but  preoccupation with thoughts about death)   - INTENT:  "Do you have thoughts of hurting or killing someone right NOW?" (e.g., yes, no, N/A)   - PLAN: "Do you have a specific plan for how you would do this?" (e.g., gun, knife, no plan, N/A)    no 7. FUNCTIONAL IMPAIRMENT: "How have things been going for you overall in your life? Have you had any more difficulties than usual doing your normal daily activities?"  (e.g., better, same, worse; self-care, school, work, interactions)     Able to function and carry on activities 8. SUPPORT: "Who is with you now?" "Who do you live with?" "Do you have family or friends nearby who you can talk to?"      Lives with wife and children 9. THERAPIST: "Do you have a counselor or therapist? Name?"     no 10. STRESSORS: "Has there been any new stress or recent changes in your life?"       no 11. CAFFEINE ABUSE: "Do you drink caffeinated beverages, and how much each day?" (e.g., coffee, tea, colas)      Occasional glass of tea 12. SUBSTANCE ABUSE: "Do you use any illegal drugs or alcohol?"       no 13. OTHER SYMPTOMS: "Do you have any other physical symptoms right now?" (e.g., chest pain, palpitations, difficulty breathing, fever)       no 14. PREGNANCY: "Is there  any chance you are pregnant?" "When was your last menstrual period?"       n/a  Protocols used: [place] Vivian, INSOMNIA-A-AH

## 2018-12-18 NOTE — Telephone Encounter (Signed)
Seen in office.

## 2018-12-18 NOTE — Progress Notes (Signed)
Subjective:    Patient ID: Paul Silva, male    DOB: 10-26-1985, 33 y.o.   MRN: [id number]  HPI   Patient presents to clinic to discuss anxiety and insomnia.  Patient has dealt with insomnia for many years, states for as long as he can remember throughout his teens in adult life he has had a hard time sleeping.  Over the past year or so feels that his insomnia is gotten worse, usually will get between 3 to 4 hours of sleep per day and then be awake so he will get up and start doing things around the house or get ready for work because he cannot get back to sleep.  He had tried trazodone for period of 3 weeks, but did not feel any effect from the medication so he stopped taking.  He also has tried multiple other medications throughout the years including melatonin, ZzzQuil, lavender, Benadryl and also Unisom without any effect.  Patient states his mind seems to race almost all the time, he is always thinking about different things and worrying about things.  States he tries to do things he enjoys like mowing the lawn, playing basketball or doing workouts however when he is doing any of these activities he will always have thoughts run through his mind about his he providing well enough for his family, as he raised his children right, things he is to get done at work, worrying about his health due to family history of elevated blood pressure, his BP being high at times, and heart attacks.  States he also has irritable at times due to not getting sleep, and does not want to be irritable around his children.  Patient has had elevated lipids in past, not had labs in about a year. Has been trying to eat healthy and eat better.   Denies any SI or HI.  Has never seen a therapist, would prefer not to have to see a therapist at this time if he can avoid.  Patient works as a Engineer, structural, has worked in [employer] for about 10 years.  His job is high stress and demanding, day alternate shifts every month so he  will be on a dayshift rotation for a month and a night shift rotation for a month.  Patient Active Problem List   Diagnosis Date Noted   Low testosterone in male 05/28/2018   Vitamin D deficiency 05/28/2018   Routine physical examination 01/15/2018   Rash 05/14/2017   Insomnia 04/30/2017   Cough 04/30/2017   Social History   Tobacco Use   Smoking status: Never Smoker   Smokeless tobacco: Never Used  Substance Use Topics   Alcohol use: Yes    Alcohol/week: 0.0 standard drinks   Family History  Problem Relation Age of Onset   Hypertension Father    Diabetes Father    Prostate cancer Neg Hx    Colon cancer Neg Hx     Review of Systems  Constitutional: Negative for chills, fatigue and fever.  HENT: Negative for congestion, ear pain, sinus pain and sore throat.   Eyes: Negative.   Respiratory: Negative for cough, shortness of breath and wheezing.   Cardiovascular: Negative for chest pain, palpitations and leg swelling.  Gastrointestinal: Negative for abdominal pain, diarrhea, nausea and vomiting.  Genitourinary: Negative for dysuria, frequency and urgency.  Musculoskeletal: Negative for arthralgias and myalgias.  Skin: Negative for color change, pallor and rash.  Neurological: Negative for syncope, light-headedness and headaches.  Psychiatric/Behavioral: The patient  is nervous/anxious.  Insomnia issues for years.     Objective:   Physical Exam Vitals signs and nursing note reviewed.  Constitutional:      General: He is not in acute distress.    Appearance: He is not toxic-appearing.  HENT:     Head: Normocephalic and atraumatic.  Eyes:     General: No scleral icterus.    Extraocular Movements: Extraocular movements intact.     Pupils: Pupils are equal, round, and reactive to light.  Cardiovascular:     Rate and Rhythm: Normal rate and regular rhythm.     Heart sounds: Normal heart sounds.  Pulmonary:     Effort: Pulmonary effort is normal. No  respiratory distress.     Breath sounds: Normal breath sounds.  Neurological:     Mental Status: He is alert and oriented to person, place, and time.     Gait: Gait normal.  Psychiatric:        Mood and Affect: Mood is anxious (worries about self, his children, wants to be the best he can be for them).        Thought Content: Thought content normal.        Judgment: Judgment normal.    Vitals:   12/18/18 0932  BP: (!) 130/98  Pulse: 76  Resp: 18  Temp: 98.1 F (36.7 C)  SpO2: 98%      Depression screen [facility] 2/9 12/18/2018 05/28/2018 04/30/2017  Decreased Interest 0 0 0  Down, Depressed, Hopeless 0 0 0  PHQ - 2 Score 0 0 0  Altered sleeping 3 - -  Tired, decreased energy 0 - -  Change in appetite 0 - -  Feeling bad or failure about yourself  0 - -  Trouble concentrating 1 - -  Moving slowly or fidgety/restless 0 - -  Suicidal thoughts 0 - -  PHQ-9 Score 4 - -  Difficult doing work/chores Somewhat difficult - -   GAD 7 : Generalized Anxiety Score 12/18/2018  Nervous, Anxious, on Edge 2  Control/stop worrying 2  Worry too much - different things 3  Trouble relaxing 2  Restless 1  Easily annoyed or irritable 2  Afraid - awful might happen 3  Total GAD 7 Score 15  Anxiety Difficulty Somewhat difficult    Assessment & Plan:    A total of 45 minutes were spent face-to-face with the patient during this encounter and over half of that time was spent on counseling and coordination of care. The patient was counseled on anxiety, insomnia, strategies to help improve this.  Healthy diet and regular exercise.  Plan for blood work at next visit.  Anxiety- Long discussion with patient regards to his symptoms and GAD score coinciding with anxiety.  Discussed our options for treatment and we agreed that a trial of Lexapro could be beneficial to patient.  He will also take hydroxyzine as needed for any breakthrough episodes of nervousness or panic and also hydroxyzine can potentially help  him with insomnia.  Patient will consider seeing a counselor, he is aware that we do have a licensed clinical social worker housed in our office with whom he can set up appointments with for counseling sessions.  Insomnia- suspect insomnia is exacerbated by patient's anxiety.  I am hopeful that the Lexapro will help improve his insomnia symptoms.  He will use hydroxyzine as needed before bedtime to see if this helps him get some sleep.  Strongly encouraged him to not become reliant on hydroxyzine  as a sleep aid.  Discussed good sleep hygiene and a wind down routine to get self to sleep.  Irritable-irritability could be related to combination of anxiety as well as insomnia.  Elevated BP without diagnosis hypertension-patient's BP is elevated in office today.  He will continue to monitor and continue to work on healthy diet and regular exercise.  If BPs remain elevated, we consider possibility of adding on a blood pressure medication especially due to his family history of heart attacks.  Hyperlipidemia-patient does have a history of hyperlipidemia, he does not want to start medication for this.  He has been working on [employer] and regular exercise.  Also started a fish oil supplement on his own.  We will plan to do blood work at his next visit to check on cholesterol levels.  Discussed with patient that if his levels do continue to remain elevated, he most likely will need to start a cholesterol medication especially due to his family history.  Advised patient that oftentimes people who have a genetic disposition for higher cholesterol, can have some effect with cholesterol control with diet and exercise but more often than not they have to take medications for big improvements in their numbers.  Patient will follow-up here in 3 to 4 weeks for recheck on mood, blood pressure and cholesterol.  He is aware he can call clinic anytime if issues arise.

## 2018-12-18 NOTE — Patient Instructions (Signed)
Insomnia Insomnia is a sleep disorder that makes it difficult to fall asleep or stay asleep. Insomnia can cause fatigue, low energy, difficulty concentrating, mood swings, and poor performance at work or school. There are three different ways to classify insomnia:  Difficulty falling asleep.  Difficulty staying asleep.  Waking up too early in the morning. Any type of insomnia can be long-term (chronic) or short-term (acute). Both are common. Short-term insomnia usually lasts for three months or less. Chronic insomnia occurs at least three times a week for longer than three months. What are the causes? Insomnia may be caused by another condition, situation, or substance, such as:  Anxiety.  Certain medicines.  Gastroesophageal reflux disease (GERD) or other gastrointestinal conditions.  Asthma or other breathing conditions.  Restless legs syndrome, sleep apnea, or other sleep disorders.  Chronic pain.  Menopause.  Stroke.  Abuse of alcohol, tobacco, or illegal drugs.  Mental health conditions, such as depression.  Caffeine.  Neurological disorders, such as Alzheimer's disease.  An overactive thyroid (hyperthyroidism). Sometimes, the cause of insomnia may not be known. What increases the risk? Risk factors for insomnia include:  Gender. Women are affected more often than men.  Age. Insomnia is more common as you get older.  Stress.  Lack of exercise.  Irregular work schedule or working night shifts.  Traveling between different time zones.  Certain medical and mental health conditions. What are the signs or symptoms? If you have insomnia, the main symptom is having trouble falling asleep or having trouble staying asleep. This may lead to other symptoms, such as:  Feeling fatigued or having low energy.  Feeling nervous about going to sleep.  Not feeling rested in the morning.  Having trouble concentrating.  Feeling irritable, anxious, or depressed. How  is this diagnosed? This condition may be diagnosed based on:  Your symptoms and medical history. Your health care provider may ask about: ? Your sleep habits. ? Any medical conditions you have. ? Your mental health.  A physical exam. How is this treated? Treatment for insomnia depends on the cause. Treatment may focus on treating an underlying condition that is causing insomnia. Treatment may also include:  Medicines to help you sleep.  Counseling or therapy.  Lifestyle adjustments to help you sleep better. Follow these instructions at home: Eating and drinking   Limit or avoid alcohol, caffeinated beverages, and cigarettes, especially close to bedtime. These can disrupt your sleep.  Do not eat a large meal or eat spicy foods right before bedtime. This can lead to digestive discomfort that can make it hard for you to sleep. Sleep habits   Keep a sleep diary to help you and your health care provider figure out what could be causing your insomnia. Write down: ? When you sleep. ? When you wake up during the night. ? How well you sleep. ? How rested you feel the next day. ? Any side effects of medicines you are taking. ? What you eat and drink.  Make your bedroom a dark, comfortable place where it is easy to fall asleep. ? Put up shades or blackout curtains to block light from outside. ? Use a white noise machine to block noise. ? Keep the temperature cool.  Limit screen use before bedtime. This includes: ? Watching TV. ? Using your smartphone, tablet, or computer.  Stick to a routine that includes going to bed and waking up at the same times every day and night. This can help you fall asleep faster. Consider   making a quiet activity, such as reading, part of your nighttime routine.  Try to avoid taking naps during the day so that you sleep better at night.  Get out of bed if you are still awake after 15 minutes of trying to sleep. Keep the lights down, but try reading or  doing a quiet activity. When you feel sleepy, go back to bed. General instructions  Take over-the-counter and prescription medicines only as told by your health care provider.  Exercise regularly, as told by your health care provider. Avoid exercise starting several hours before bedtime.  Use relaxation techniques to manage stress. Ask your health care provider to suggest some techniques that may work well for you. These may include: ? Breathing exercises. ? Routines to release muscle tension. ? Visualizing peaceful scenes.  Make sure that you drive carefully. Avoid driving if you feel very sleepy.  Keep all follow-up visits as told by your health care provider. This is important. Contact a health care provider if:  You are tired throughout the day.  You have trouble in your daily routine due to sleepiness.  You continue to have sleep problems, or your sleep problems get worse. Get help right away if:  You have serious thoughts about hurting yourself or someone else. If you ever feel like you may hurt yourself or others, or have thoughts about taking your own life, get help right away. You can go to your nearest emergency department or call:  Your local emergency services (911 in the [place]  A suicide crisis helpline, such as the National Suicide Prevention Lifeline at 1-800-273-8255. This is open 24 hours a day. Summary  Insomnia is a sleep disorder that makes it difficult to fall asleep or stay asleep.  Insomnia can be long-term (chronic) or short-term (acute).  Treatment for insomnia depends on the cause. Treatment may focus on treating an underlying condition that is causing insomnia.  Keep a sleep diary to help you and your health care provider figure out what could be causing your insomnia. This information is not intended to replace advice given to you by your health care provider. Make sure you discuss any questions you have with your health care provider. Document  Released: 09/07/2000 Document Revised: 06/20/2017 Document Reviewed: 06/20/2017 Elsevier Interactive Patient Education  2019 [employer]  Generalized Anxiety Disorder, Adult Generalized anxiety disorder (GAD) is a mental health disorder. People with this condition constantly worry about everyday events. Unlike normal anxiety, worry related to GAD is not triggered by a specific event. These worries also do not fade or get better with time. GAD interferes with life functions, including relationships, work, and school. GAD can vary from mild to severe. People with severe GAD can have intense waves of anxiety with physical symptoms (panic attacks). What are the causes? The exact cause of GAD is not known. What increases the risk? This condition is more likely to develop in:  Women.  People who have a family history of anxiety disorders.  People who are very shy.  People who experience very stressful life events, such as the death of a loved one.  People who have a very stressful family environment. What are the signs or symptoms? People with GAD often worry excessively about many things in their lives, such as their health and family. They may also be overly concerned about:  Doing well at work.  Being on time.  Natural disasters.  Friendships. Physical symptoms of GAD include:  Fatigue.  Muscle tension or having   muscle twitches.  Trembling or feeling shaky.  Being easily startled.  Feeling like your heart is pounding or racing.  Feeling out of breath or like you cannot take a deep breath.  Having trouble falling asleep or staying asleep.  Sweating.  Nausea, diarrhea, or irritable bowel syndrome (IBS).  Headaches.  Trouble concentrating or remembering facts.  Restlessness.  Irritability. How is this diagnosed? Your health care provider can diagnose GAD based on your symptoms and medical history. You will also have a physical exam. The health care provider will  ask specific questions about your symptoms, including how severe they are, when they started, and if they come and go. Your health care provider may ask you about your use of alcohol or drugs, including prescription medicines. Your health care provider may refer you to a mental health specialist for further evaluation. Your health care provider will do a thorough examination and may perform additional tests to rule out other possible causes of your symptoms. To be diagnosed with GAD, a person must have anxiety that:  Is out of his or her control.  Affects several different aspects of his or her life, such as work and relationships.  Causes distress that makes him or her unable to take part in normal activities.  Includes at least three physical symptoms of GAD, such as restlessness, fatigue, trouble concentrating, irritability, muscle tension, or sleep problems. Before your health care provider can confirm a diagnosis of GAD, these symptoms must be present more days than they are not, and they must last for six months or longer. How is this treated? The following therapies are usually used to treat GAD:  Medicine. Antidepressant medicine is usually prescribed for long-term daily control. Antianxiety medicines may be added in severe cases, especially when panic attacks occur.  Talk therapy (psychotherapy). Certain types of talk therapy can be helpful in treating GAD by providing support, education, and guidance. Options include: ? Cognitive behavioral therapy (CBT). People learn coping skills and techniques to ease their anxiety. They learn to identify unrealistic or negative thoughts and behaviors and to replace them with positive ones. ? Acceptance and commitment therapy (ACT). This treatment teaches people how to be mindful as a way to cope with unwanted thoughts and feelings. ? Biofeedback. This process trains you to manage your body's response (physiological response) through breathing  techniques and relaxation methods. You will work with a therapist while machines are used to monitor your physical symptoms.  Stress management techniques. These include yoga, meditation, and exercise. A mental health specialist can help determine which treatment is best for you. Some people see improvement with one type of therapy. However, other people require a combination of therapies. Follow these instructions at home:  Take over-the-counter and prescription medicines only as told by your health care provider.  Try to maintain a normal routine.  Try to anticipate stressful situations and allow extra time to manage them.  Practice any stress management or self-calming techniques as taught by your health care provider.  Do not punish yourself for setbacks or for not making progress.  Try to recognize your accomplishments, even if they are small.  Keep all follow-up visits as told by your health care provider. This is important. Contact a health care provider if:  Your symptoms do not get better.  Your symptoms get worse.  You have signs of depression, such as: ? A persistently sad, cranky, or irritable mood. ? Loss of enjoyment in activities that used to bring you joy. ?   Change in weight or eating. ? Changes in sleeping habits. ? Avoiding friends or family members. ? Loss of energy for normal tasks. ? Feelings of guilt or worthlessness. Get help right away if:  You have serious thoughts about hurting yourself or others. If you ever feel like you may hurt yourself or others, or have thoughts about taking your own life, get help right away. You can go to your nearest emergency department or call:  Your local emergency services (911 in the [place]  A suicide crisis helpline, such as the National Suicide Prevention Lifeline at 1-800-273-8255. This is open 24 hours a day. Summary  Generalized anxiety disorder (GAD) is a mental health disorder that involves worry that is not  triggered by a specific event.  People with GAD often worry excessively about many things in their lives, such as their health and family.  GAD may cause physical symptoms such as restlessness, trouble concentrating, sleep problems, frequent sweating, nausea, diarrhea, headaches, and trembling or muscle twitching.  A mental health specialist can help determine which treatment is best for you. Some people see improvement with one type of therapy. However, other people require a combination of therapies. This information is not intended to replace advice given to you by your health care provider. Make sure you discuss any questions you have with your health care provider. Document Released: 01/05/2013 Document Revised: 07/31/2016 Document Reviewed: 07/31/2016 Elsevier Interactive Patient Education  2019 [employer]  

## 2019-01-08 ENCOUNTER — Telehealth: Payer: Self-pay | Admitting: Family Medicine

## 2019-01-08 ENCOUNTER — Ambulatory Visit (INDEPENDENT_AMBULATORY_CARE_PROVIDER_SITE_OTHER): Payer: Managed Care, Other (non HMO) | Admitting: Family Medicine

## 2019-01-08 ENCOUNTER — Other Ambulatory Visit: Payer: Self-pay

## 2019-01-08 ENCOUNTER — Encounter: Payer: Self-pay | Admitting: Family Medicine

## 2019-01-08 DIAGNOSIS — F419 Anxiety disorder, unspecified: Secondary | ICD-10-CM

## 2019-01-08 DIAGNOSIS — G4700 Insomnia, unspecified: Secondary | ICD-10-CM | POA: Diagnosis not present

## 2019-01-08 MED ORDER — ESCITALOPRAM OXALATE 10 MG PO TABS: 15.0000 mg | ORAL_TABLET | Freq: Every day | ORAL | 2 refills | Status: DC

## 2019-01-08 NOTE — Progress Notes (Signed)
Patient ID: Paul Silva, male   DOB: [birth year], 33 y.o.   MRN: [id number]  Virtual Visit via video Note  This visit type was conducted due to national recommendations for restrictions regarding the COVID-19 pandemic (e.g. social distancing).  This format is felt to be most appropriate for this patient at this time.  All issues noted in this document were discussed and addressed.  No physical exam was performed (except for noted visual exam findings with Video Visits).   I connected with Lillia Corporal on 01/08/19 at  9:20 AM EDT by a video enabled telemedicine application and verified that I am speaking with the correct person using two identifiers. Location patient: home Location provider: [facility] Persons participating in the virtual visit: patient, provider  I discussed the limitations, risks, security and privacy concerns of performing an evaluation and management service by video and the availability of in person appointments. I also discussed with the patient that there may be a patient responsible charge related to this service. The patient expressed understanding and agreed to proceed.   HPI:  Patient & I connected via video today to discuss anxiety and insomnia after beginning low-dose Lexapro 10 mg/day.  Patient states he feels this medication is helping.  He still has some anxiety, but feels his mind is not racing as much as it was.  He is also been having easier time getting to sleep.  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  Denies SI or HI.   Review of Systems  Constitutional: Negative for chills, fatigue and fever.  HENT: Negative for congestion, ear pain, sinus pain and sore throat.   Eyes: Negative.   Respiratory: Negative for cough,  shortness of breath and wheezing.   Cardiovascular: Negative for chest pain, palpitations and leg swelling.  Gastrointestinal: Negative for abdominal pain, diarrhea, nausea and vomiting.  Genitourinary: Negative for dysuria, frequency and urgency.  Musculoskeletal: Negative for arthralgias and myalgias.  Skin: Negative for color change, pallor and rash.  Neurological: Negative for syncope, light-headedness and headaches.  Psychiatric/Behavioral: The patient is not nervous/anxious.     Past Medical History:  Diagnosis Date  . Asthma   . Fracture of thumb   . Frequent headaches   . Migraines    Family History  Problem Relation Age of Onset  . Hypertension Father   . Diabetes Father   . Prostate cancer Neg Hx   . Colon cancer Neg Hx     Current Outpatient Medications:  .  Ergocalciferol 2000 units TABS, Take 1 tablet by mouth daily., Disp: 30 tablet, Rfl: 3 .  escitalopram (LEXAPRO) 10 MG tablet, Take 1.5 tablets (15 mg total) by mouth daily., Disp: 45 tablet, Rfl: 2 .  hydrOXYzine (ATARAX/VISTARIL) 10 MG tablet, Take 1 tablet (10 mg total) by mouth 3 (three) times daily as needed for anxiety (or as needed for sleep). CAUSES DROWSINESS, Disp: 30 tablet, Rfl: 1 .  PROAIR HFA 108 (90 Base) MCG/ACT inhaler, , Disp: , Rfl:  .  testosterone (ANDROGEL) 50 MG/5GM (1%) GEL, Place 5 g onto the skin daily., Disp: 150 g, Rfl: 2  EXAM:  GENERAL: alert, oriented, appears well and in no acute distress  HEENT: atraumatic, conjunttiva clear, no obvious abnormalities on inspection  of external nose and ears  NECK: normal movements of the head and neck  LUNGS: on inspection no signs of respiratory distress, breathing rate appears normal, no obvious gross SOB, gasping or wheezing  CV: no obvious cyanosis  MS: moves all visible extremities without noticeable abnormality  PSYCH/NEURO: pleasant and cooperative, no obvious depression or anxiety, speech and thought processing grossly  intact  ASSESSMENT AND PLAN:  Discussed the following assessment and plan:  Anxiety - Plan: escitalopram (LEXAPRO) 10 MG tablet  Insomnia, unspecified type - Plan: escitalopram (LEXAPRO) 10 MG tablet  We will trial increase of Lexapro from 10 mg daily to 15 mg daily.  Patient will follow-up in 1 month.  Return visit to discuss how increasing the dose has affected his anxiety.   I discussed the assessment and treatment plan with the patient. The patient was provided an opportunity to ask questions and all were answered. The patient agreed with the plan and demonstrated an understanding of the instructions.   The patient was advised to call back or seek an in-person evaluation if the symptoms worsen or if the condition fails to improve as anticipated.   Jodelle Green, FNP

## 2019-01-08 NOTE — Telephone Encounter (Signed)
Called and scheduled Pt a Follow up appt for 02/19/19 @ 8:00am

## 2019-01-08 NOTE — Telephone Encounter (Signed)
Please schedule a virtual follow up visit with patient in 4 weeks (mid-May 2020)  Thanks,  LG

## 2019-01-21 ENCOUNTER — Ambulatory Visit: Payer: Managed Care, Other (non HMO) | Admitting: Urology

## 2019-02-19 ENCOUNTER — Ambulatory Visit: Payer: Managed Care, Other (non HMO) | Admitting: Family Medicine

## 2019-03-11 ENCOUNTER — Ambulatory Visit: Payer: Managed Care, Other (non HMO) | Admitting: Urology

## 2019-03-25 ENCOUNTER — Ambulatory Visit (INDEPENDENT_AMBULATORY_CARE_PROVIDER_SITE_OTHER): Payer: Managed Care, Other (non HMO) | Admitting: Family

## 2019-03-25 ENCOUNTER — Encounter: Payer: Self-pay | Admitting: Family

## 2019-03-25 ENCOUNTER — Other Ambulatory Visit: Payer: Self-pay

## 2019-03-25 DIAGNOSIS — F419 Anxiety disorder, unspecified: Secondary | ICD-10-CM | POA: Diagnosis not present

## 2019-03-25 DIAGNOSIS — G4700 Insomnia, unspecified: Secondary | ICD-10-CM

## 2019-03-25 MED ORDER — FLUOXETINE HCL 20 MG PO CAPS: 20.0000 mg | ORAL_CAPSULE | Freq: Every morning | ORAL | 3 refills | Status: DC

## 2019-03-25 MED ORDER — PROAIR HFA 108 (90 BASE) MCG/ACT IN AERS: 1.0000 | INHALATION_SPRAY | Freq: Four times a day (QID) | RESPIRATORY_TRACT | 1 refills | Status: DC | PRN

## 2019-03-25 NOTE — Patient Instructions (Signed)
Stop lexapro  Start prozac  Please let us know how you are doing.   CONGRATS ON YOUR MASTERS!

## 2019-03-25 NOTE — Assessment & Plan Note (Signed)
Depression screen [facility] 2/9 03/25/2019 12/18/2018 05/28/2018 04/30/2017  Decreased Interest 0 0 0 0  Down, Depressed, Hopeless 0 0 0 0  PHQ - 2 Score 0 0 0 0  Altered sleeping 2 3 - -  Tired, decreased energy 0 0 - -  Change in appetite 0 0 - -  Feeling bad or failure about yourself  0 0 - -  Trouble concentrating 1 1 - -  Moving slowly or fidgety/restless 0 0 - -  Suicidal thoughts 0 0 - -  PHQ-9 Score 3 4 - -  Difficult doing work/chores Not difficult at all Somewhat difficult - -   Stop lexapro. Start prozac. Close f/u in 6-8 weeks.

## 2019-03-25 NOTE — Progress Notes (Signed)
Verbal consent for services obtained from patient prior to services given.  Location of call:  provider at work patient at home  Names of all persons present for services: Mable Paris, NP Chief complaint:   Anxiety- continues to have days, episodes where feels anxious. On lexapro 15mg  for past 3 months and helped a lot and in particular with mood swings. Sleeping better on 15mg .  Anxiety increased. 'has anxiety episodes' and will take atarax prn in feels more 'mellow.'  Hard to to be motivated with school, making A's,  has 3 more days till graduation.Tested negative for ADHD in elementary school.  In school currently.  No depression. Life at home is good.  No si/hi.   Father is on prozac and he wanders if this would be more effective for him.   Refill of proair.   Non smoker    A/P/next steps:  Problem List Items Addressed This Visit      Other   Insomnia   Anxiety    Depression screen [facility] 2/9 03/25/2019 12/18/2018 05/28/2018 04/30/2017  Decreased Interest 0 0 0 0  Down, Depressed, Hopeless 0 0 0 0  PHQ - 2 Score 0 0 0 0  Altered sleeping 2 3 - -  Tired, decreased energy 0 0 - -  Change in appetite 0 0 - -  Feeling bad or failure about yourself  0 0 - -  Trouble concentrating 1 1 - -  Moving slowly or fidgety/restless 0 0 - -  Suicidal thoughts 0 0 - -  PHQ-9 Score 3 4 - -  Difficult doing work/chores Not difficult at all Somewhat difficult - -   Stop lexapro. Start prozac. Close f/u in 6-8 weeks.       Relevant Medications   FLUoxetine (PROZAC) 20 MG capsule      I spent 25 min  discussing plan of care over the phone.

## 2019-03-26 NOTE — Progress Notes (Signed)
LMTCB to schedule 6 week f/u.

## 2019-04-01 ENCOUNTER — Telehealth: Payer: Self-pay

## 2019-04-01 NOTE — Telephone Encounter (Signed)
I called patient to try to schedule f/u 6-8 weeks.

## 2019-04-22 ENCOUNTER — Ambulatory Visit: Payer: Managed Care, Other (non HMO) | Admitting: Urology

## 2019-05-06 ENCOUNTER — Ambulatory Visit: Payer: Managed Care, Other (non HMO) | Admitting: Urology

## 2019-05-11 ENCOUNTER — Other Ambulatory Visit: Payer: Self-pay

## 2019-05-13 ENCOUNTER — Other Ambulatory Visit: Payer: Self-pay

## 2019-05-13 ENCOUNTER — Encounter: Payer: Self-pay | Admitting: Family

## 2019-05-13 ENCOUNTER — Ambulatory Visit (INDEPENDENT_AMBULATORY_CARE_PROVIDER_SITE_OTHER): Payer: Managed Care, Other (non HMO) | Admitting: Family

## 2019-05-13 VITALS — Ht 71.5 in | Wt 290.0 lb

## 2019-05-13 DIAGNOSIS — F419 Anxiety disorder, unspecified: Secondary | ICD-10-CM | POA: Diagnosis not present

## 2019-05-13 MED ORDER — PROAIR HFA 108 (90 BASE) MCG/ACT IN AERS: 1.0000 | INHALATION_SPRAY | Freq: Four times a day (QID) | RESPIRATORY_TRACT | 1 refills | Status: DC | PRN

## 2019-05-13 NOTE — Progress Notes (Signed)
  This visit type was conducted due to national recommendations for restrictions regarding the COVID-19 pandemic (e.g. social distancing).  This format is felt to be most appropriate for this patient at this time.  All issues noted in this document were discussed and addressed.  No physical exam was performed (except for noted visual exam findings with Video Visits). Virtual Visit via Video Note  I connected with@  on 05/13/19 at  8:00 AM EDT by a video enabled telemedicine application and verified that I am speaking with the correct person using two identifiers.  Location patient: home Location provider:work  Persons participating in the virtual visit: patient, provider  I discussed the limitations of evaluation and management by telemedicine and the availability of in person appointments. The patient expressed understanding and agreed to proceed.  Interactive audio and video telecommunications were attempted between this provider and patient, however failed, due to patient having technical difficulties or patient did not have access to video capability.  We continued and completed visit with audio only.   HPI: Feels well today. No concerns.   Depression and anxiety- improved. 'feels like old self.' Sleeping better and working out more.  stopped lexapro; on prozac and likes the current dose.   ROS: See pertinent positives and negatives per HPI.  Past Medical History:  Diagnosis Date  . Asthma   . Fracture of thumb   . Frequent headaches   . Migraines     History reviewed. No pertinent surgical history.  Family History  Problem Relation Age of Onset  . Hypertension Father   . Diabetes Father   . Prostate cancer Neg Hx   . Colon cancer Neg Hx     SOCIAL HX: never smoker   Current Outpatient Medications:  .  Ergocalciferol 2000 units TABS, Take 1 tablet by mouth daily., Disp: 30 tablet, Rfl: 3 .  FLUoxetine (PROZAC) 20 MG capsule, Take 1 capsule (20 mg total) by mouth every  morning., Disp: 90 capsule, Rfl: 3 .  hydrOXYzine (ATARAX/VISTARIL) 10 MG tablet, Take 1 tablet (10 mg total) by mouth 3 (three) times daily as needed for anxiety (or as needed for sleep). CAUSES DROWSINESS, Disp: 30 tablet, Rfl: 1 .  PROAIR HFA 108 (90 Base) MCG/ACT inhaler, Inhale 1 puff into the lungs every 6 (six) hours as needed for wheezing or shortness of breath., Disp: 18 g, Rfl: 1    ASSESSMENT AND PLAN:  Discussed the following assessment and plan:  Problem List Items Addressed This Visit      Other   Anxiety - Primary    Pleased improved. Will continue prozac. Of note: patient due for annual labs; these will be scheduled      Relevant Orders   CBC with Differential/Platelet   Comprehensive metabolic panel   Hemoglobin A1c   Lipid panel   TSH   VITAMIN D 25 Hydroxy (Vit-D Deficiency, Fractures)         I discussed the assessment and treatment plan with the patient. The patient was provided an opportunity to ask questions and all were answered. The patient agreed with the plan and demonstrated an understanding of the instructions.   The patient was advised to call back or seek an in-person evaluation if the symptoms worsen or if the condition fails to improve as anticipated.   Mable Paris, FNP   I spent 15 min non face to face w/ pt.

## 2019-05-13 NOTE — Patient Instructions (Addendum)
(  Patient needs to be called for fasting labs to be scheduled.  Please call.)  Follow up in 6 months.

## 2019-05-13 NOTE — Assessment & Plan Note (Signed)
Pleased improved. Will continue prozac. Of note: patient due for annual labs; these will be scheduled

## 2019-05-13 NOTE — Telephone Encounter (Signed)
Patient requested generic brand for ProAir inhaler be sent to pharmacy since insurance company will only pay for generic.  Generic Rx sent to pharmacy.

## 2019-05-20 ENCOUNTER — Ambulatory Visit: Payer: Managed Care, Other (non HMO) | Admitting: Urology

## 2019-05-20 ENCOUNTER — Encounter: Payer: Self-pay | Admitting: Urology

## 2019-05-20 ENCOUNTER — Other Ambulatory Visit: Payer: Self-pay

## 2019-05-20 VITALS — BP 122/81 | HR 78 | Ht 71.5 in | Wt 250.0 lb

## 2019-05-20 DIAGNOSIS — R7989 Other specified abnormal findings of blood chemistry: Secondary | ICD-10-CM | POA: Diagnosis not present

## 2019-05-20 DIAGNOSIS — Z3009 Encounter for other general counseling and advice on contraception: Secondary | ICD-10-CM | POA: Diagnosis not present

## 2019-05-20 NOTE — Patient Instructions (Signed)
Testosterone Test Why am I having this test? Testosterone is a hormone made by the adrenal glands in the abdomen in both males and females. In males, it is also made by the testicles. Starting at puberty, testosterone stimulates the development of secondary sex characteristics in males. This includes a deeper voice, muscle and body hair growth, and penis enlargement. In females, testosterone is also produced in the ovaries. The male body converts testosterone into estradiol, the main male sex hormone. An abnormal level of testosterone can cause health issues in both males and females. You may have this test if your health care provider suspects that an abnormal testosterone level is causing or contributing to other health problems. In males, an abnormally low testosterone level can cause:  Inability to have children (infertility).  Trouble getting or maintaining an erection (erectile dysfunction).  Delayed puberty. In females, an abnormally high testosterone level can cause:  Infertility.  Polycystic ovary syndrome (PCOS).  Development of masculine features (virilization). What is being tested? This test measures the amount of total testosterone in your blood. What kind of sample is taken?  A blood sample is required for this test. It is usually collected by inserting a needle into a blood vessel. The sample is most often collected in the morning because that is when testosterone is usually the highest. Tell a health care provider about:  Any allergies you have.  All medicines you are taking, including vitamins, herbs, eye drops, creams, and over-the-counter medicines.  Any blood disorders you have.  Any surgeries you have had.  Any medical conditions you have.  Whether you are pregnant or may be pregnant. How are the results reported? Your test results will be reported as a value that indicates how much testosterone is in your blood. This will be given as nanograms of  testosterone per deciliter of blood (ng/dL). Your health care provider will compare your test results to normal ranges that were established after testing a large group of people (reference ranges). Reference ranges may vary among labs and hospitals. For this test, common reference ranges for total testosterone are:  Male: ? 7 months to 31 years old: less than 30 ng/dL. ? 10-13 years old: less than 300 ng/dL. ? 14-15 years old: 170-540 ng/dL. ? 16-19 years old: 250-910 ng/dL. ? 20 years and older: 280-1,080 ng/dL.  Male: ? 7 months to 31 years old: less than 30 ng/dL. ? 10-13 years old: less than 40 ng/dL. ? 14-15 years old: less than 60 ng/dL. ? 16-19 years old: less than 70 ng/dL. ? 20 years and older: less than 70 ng/dL. What do the results mean? A result that is within your reference range means that you have a normal amount of testosterone in your blood. In males:  A high testosterone level may mean that you: ? Have certain types of tumors. ? Have an overactive thyroid gland (hyperthyroidism). ? Currently use anabolic steroids or used anabolic steroids in the past. ? Have an inherited disorder that affects the adrenal glands (congenital adrenal hyperplasia). ? Are starting puberty early (precocious puberty).  A low testosterone level may mean that you: ? Have certain genetic diseases. ? Have had certain viral infections, such as mumps. ? Have a condition that affects the pituitary gland. ? Have injured your testicles. In females:  A high testosterone level may mean that you have: ? Certain types of tumors, such as ovarian or adrenal gland tumors. ? An inherited disorder that affects certain cells in the adrenal glands (  congenital adrenal hyperplasia). ? Polycystic ovary syndrome.  A low testosterone level usually will not cause health problems. Talk with your health care provider about what your results mean. Questions to ask your health care provider Ask your health  care provider, or the department that is doing the test:  When will my results be ready?  How will I get my results?  What are my treatment options?  What other tests do I need?  What are my next steps? Summary  Testosterone is a hormone made by the adrenal glands in the abdomen in both males and females. In males, it is also made by the testicles. Starting at puberty, testosterone stimulates the development of secondary sex characteristics in males.  In females, testosterone is also produced in the ovaries. The male body converts testosterone into estradiol, the main male sex hormone.  An abnormal level of testosterone can cause health issues in both males and females. You may have this test if your health care provider suspects that an abnormal testosterone level is causing or contributing to other health problems. This information is not intended to replace advice given to you by your health care provider. Make sure you discuss any questions you have with your health care provider. Document Released: 09/27/2004 Document Revised: 06/11/2017 Document Reviewed: 06/11/2017 Elsevier Patient Education  2020 [employer].

## 2019-05-20 NOTE — Progress Notes (Signed)
05/20/2019 10:47 AM   Paul Silva 1986/05/03 [id number]  Referring provider: Burnard Hawthorne, FNP [address] [place],  [place] [address]  Chief Complaint  Patient presents with  . Hypogonadism    HPI:  F/u low T  -   33 yo male who noticed fatigue. He does shift work with the police. Symptoms since 2019 -- August 2019 testosterone was 295.  A September 2019 testosterone was 226 with a hematocrit of 42. He started Androgel. He was on 1 gel pack per day. His T was 351 in Jan 2020 on T. He may have a little more energy. He is exercising. Libido is lower. His erections are OK. He has two kids age 75 yo and 69 yo. They may have more kids. He is studying for his masters.   He stopped T replacement and his T was 306, free T 9.4 in Mar 2020. He still notes some fatigue and low libido. He did lose 11 lbs with exercise. He needs to eat better.   They are consider more kids about also vasectomy. They have two kids -- daughter age 28 yo and son age 27 yo.    PMH: Past Medical History:  Diagnosis Date  . Asthma   . Fracture of thumb   . Frequent headaches   . Migraines     Surgical History: No past surgical history on file.  Home Medications:  Allergies as of 05/20/2019   No Known Allergies     Medication List       Accurate as of May 20, 2019 10:47 AM. If you have any questions, ask your nurse or doctor.        Ergocalciferol 50 MCG (2000 UT) Tabs Take 1 tablet by mouth daily.   FLUoxetine 20 MG capsule Commonly known as: PROZAC Take 1 capsule (20 mg total) by mouth every morning.   hydrOXYzine 10 MG tablet Commonly known as: ATARAX/VISTARIL Take 1 tablet (10 mg total) by mouth 3 (three) times daily as needed for anxiety (or as needed for sleep). CAUSES DROWSINESS   ProAir HFA 108 (90 Base) MCG/ACT inhaler Generic drug: albuterol Inhale 1 puff into the lungs every 6 (six) hours as needed for wheezing or shortness of breath.       Allergies: No  Known Allergies  Family History: Family History  Problem Relation Age of Onset  . Hypertension Father   . Diabetes Father   . Prostate cancer Neg Hx   . Colon cancer Neg Hx     Social History:  reports that he has never smoked. He has never used smokeless tobacco. He reports previous alcohol use. He reports that he does not use drugs.  ROS: UROLOGY Frequent Urination?: No Hard to postpone urination?: No Burning/pain with urination?: No Get up at night to urinate?: No Leakage of urine?: No Urine stream starts and stops?: No Trouble starting stream?: No Do you have to strain to urinate?: No Blood in urine?: No Urinary tract infection?: No Sexually transmitted disease?: No Injury to kidneys or bladder?: No Painful intercourse?: No Weak stream?: No Erection problems?: No Penile pain?: No  Gastrointestinal Nausea?: No Vomiting?: No Indigestion/heartburn?: No Diarrhea?: No Constipation?: No  Constitutional Fever: No Night sweats?: No Weight loss?: No Fatigue?: No  Skin Skin rash/lesions?: No Itching?: No  Eyes Blurred vision?: No Double vision?: No  Ears/Nose/Throat Sore throat?: No Sinus problems?: No  Hematologic/Lymphatic Swollen glands?: No Easy bruising?: No  Cardiovascular Leg swelling?: No Chest pain?:  No  Respiratory Cough?: No Shortness of breath?: No  Endocrine Excessive thirst?: No  Musculoskeletal Back pain?: No Joint pain?: No  Neurological Headaches?: No Dizziness?: No  Psychologic Depression?: No Anxiety?: No  Physical Exam: BP 122/81 (BP Location: Left Arm, Patient Position: Sitting, Cuff Size: Normal)   Pulse 78   Ht 5' 11.5" (1.816 m)   Wt 113.4 kg   BMI 34.38 kg/m   Constitutional:  Alert and oriented, No acute distress. HEENT: [place] AT, moist mucus membranes.  Trachea midline, no masses. Cardiovascular: No clubbing, cyanosis, or edema. Respiratory: Normal respiratory effort, no increased work of breathing. GI:  Abdomen is soft, nontender, nondistended, no abdominal masses GU: No CVA tenderness Skin: No rashes, bruises or suspicious lesions. Neurologic: Grossly intact, no focal deficits, moving all 4 extremities. Psychiatric: Normal mood and affect.  Laboratory Data: Lab Results  Component Value Date   WBC 2.9 (L) 05/20/2018   HGB 13.9 10/22/2018   HCT 42.3 10/22/2018   MCV 79.0 05/20/2018   PLT 159.0 05/20/2018    Lab Results  Component Value Date   CREATININE 1.15 05/20/2018    No results found for: PSA  Lab Results  Component Value Date   TESTOSTERONE 306 12/05/2018    Lab Results  Component Value Date   HGBA1C 5.9 01/20/2018    Urinalysis No results found for: COLORURINE, APPEARANCEUR, LABSPEC, PHURINE, GLUCOSEU, HGBUR, BILIRUBINUR, KETONESUR, PROTEINUR, UROBILINOGEN, NITRITE, LEUKOCYTESUR  No results found for: LABMICR, [place], RBCUA, LABEPIT, MUCUS, BACTERIA  Pertinent Imaging: n/a No results found for this or any previous visit. No results found for this or any previous visit. No results found for this or any previous visit. No results found for this or any previous visit. No results found for this or any previous visit. No results found for this or any previous visit. No results found for this or any previous visit. No results found for this or any previous visit.  Assessment & Plan:    Low T - surveillance labs sent and we discussed nature r/b/a of a clomiphene trial.   Vasectomy - we discussed the nature r/b/a to vasectomy - permanent nature and non-immediate sterile result discussed as well. He will consider.   No follow-ups on file.  Festus Aloe, MD  [facility] [address], [place], [place] [address] [phone]

## 2019-05-22 LAB — PROLACTIN: Prolactin: 4.5 ng/mL (ref 4.0–15.2)

## 2019-05-22 LAB — LUTEINIZING HORMONE: LH: 3.3 m[IU]/mL (ref 1.7–8.6)

## 2019-05-22 LAB — TESTOSTERONE,FREE AND TOTAL
Testosterone, Free: 8.8 pg/mL (ref 8.7–25.1)
Testosterone: 312 ng/dL (ref 264–916)

## 2019-05-25 ENCOUNTER — Telehealth: Payer: Self-pay | Admitting: Family Medicine

## 2019-05-25 MED ORDER — CLOMIPHENE CITRATE 50 MG PO TABS: 50.0000 mg | ORAL_TABLET | ORAL | 3 refills | Status: DC

## 2019-05-25 NOTE — Telephone Encounter (Signed)
-----   Message from Chrystie Nose, [place] sent at 05/25/2019 10:23 AM EDT -----  ----- Message ----- From: Festus Aloe, MD Sent: 05/25/2019  10:20 AM EDT To: Chrystie Nose, CMA  Notify patient his testosterone is low normal - he's a good candidate for clomiphene. Take Monday and Thursday to boost T level. I send a Rx. Have him return in 5 weeks for a testosterone check. Thank you.  ----- Message ----- From: Chrystie Nose, CMA Sent: 05/22/2019  11:38 AM EDT To: Festus Aloe, MD   ----- Message ----- From: Lavone Neri Lab Results In Sent: 05/21/2019   7:37 AM EDT To: Rowe Robert Clinical

## 2019-05-25 NOTE — Addendum Note (Signed)
Addended by: Festus Aloe R on: 05/25/2019 10:20 AM   Modules accepted: Orders

## 2019-05-25 NOTE — Telephone Encounter (Signed)
Patient notified and voiced understanding. He will call and schedule a lab appointment for a testosterone check in 5 weeks once he has the medication in hand.

## 2019-07-27 ENCOUNTER — Encounter: Payer: Self-pay | Admitting: Family

## 2019-07-28 ENCOUNTER — Other Ambulatory Visit: Payer: Self-pay

## 2019-07-28 DIAGNOSIS — G4700 Insomnia, unspecified: Secondary | ICD-10-CM

## 2019-07-28 DIAGNOSIS — F419 Anxiety disorder, unspecified: Secondary | ICD-10-CM

## 2019-07-28 MED ORDER — HYDROXYZINE HCL 10 MG PO TABS: 10.0000 mg | ORAL_TABLET | Freq: Three times a day (TID) | ORAL | 1 refills | Status: DC | PRN

## 2019-08-03 ENCOUNTER — Other Ambulatory Visit: Payer: Self-pay

## 2019-08-03 ENCOUNTER — Encounter: Payer: Self-pay | Admitting: Family Medicine

## 2019-08-03 ENCOUNTER — Ambulatory Visit: Payer: Managed Care, Other (non HMO) | Admitting: Family Medicine

## 2019-08-03 VITALS — BP 122/86 | HR 96 | Temp 96.9°F | Wt 262.2 lb

## 2019-08-03 DIAGNOSIS — S8992XA Unspecified injury of left lower leg, initial encounter: Secondary | ICD-10-CM

## 2019-08-03 DIAGNOSIS — S81812A Laceration without foreign body, left lower leg, initial encounter: Secondary | ICD-10-CM | POA: Diagnosis not present

## 2019-08-03 DIAGNOSIS — Z23 Encounter for immunization: Secondary | ICD-10-CM

## 2019-08-03 MED ORDER — ALBUTEROL SULFATE HFA 108 (90 BASE) MCG/ACT IN AERS: 1.0000 | INHALATION_SPRAY | Freq: Four times a day (QID) | RESPIRATORY_TRACT | 1 refills | Status: DC | PRN

## 2019-08-03 MED ORDER — CEPHALEXIN 500 MG PO CAPS: 500.0000 mg | ORAL_CAPSULE | Freq: Four times a day (QID) | ORAL | 0 refills | Status: DC

## 2019-08-03 NOTE — Progress Notes (Signed)
Subjective:    Patient ID: Paul Silva, male    DOB: 03-Jul-1986, 33 y.o.   MRN: [id number]  HPI   Patient presents to clinic due to laceration on left shin.  States he was working with his trailer and got caught on the front of left shin on trailer edge.  This occurred on 08/01/2019.  He has been cleaning out the wound and keeping it dressed.  Denies any active bleeding or any signs of discharge/infection.  Area is somewhat tender, but does not seem to be worsening or becoming infected as far as he can tell.  Denies fever or chills.  Patient Active Problem List   Diagnosis Date Noted  . Hyperlipidemia 12/18/2018  . Anxiety 12/18/2018  . Irritable 12/18/2018  . Low testosterone in male 05/28/2018  . Vitamin D deficiency 05/28/2018  . Routine physical examination 01/15/2018  . Rash 05/14/2017  . Insomnia 04/30/2017  . Cough 04/30/2017  . Elevated BP without diagnosis of hypertension 01/06/2007   Social History   Tobacco Use  . Smoking status: Never Smoker  . Smokeless tobacco: Never Used  Substance Use Topics  . Alcohol use: Not Currently    Alcohol/week: 0.0 standard drinks    Review of Systems  Constitutional: Negative for chills, fatigue and fever.  HENT: Negative for congestion, ear pain, sinus pain and sore throat.   Eyes: Negative.   Respiratory: Negative for cough, shortness of breath and wheezing.   Cardiovascular: Negative for chest pain, palpitations and leg swelling.  Gastrointestinal: Negative for abdominal pain, diarrhea, nausea and vomiting.  Genitourinary: Negative for dysuria, frequency and urgency.  Musculoskeletal: Negative for arthralgias and myalgias.  Skin: Negative for color change, pallor and rash. +lac on left shin  Neurological: Negative for syncope, light-headedness and headaches.  Psychiatric/Behavioral: The patient is not nervous/anxious.       Objective:   Physical Exam Vitals signs and nursing note reviewed.  Constitutional:    General: He is not in acute distress.    Appearance: He is not toxic-appearing.  Cardiovascular:     Rate and Rhythm: Normal rate and regular rhythm.  Pulmonary:     Effort: Pulmonary effort is normal. No respiratory distress.     Breath sounds: Normal breath sounds.  Skin:         Comments: Approximately 1 inch laceration left shin.  It is not bleeding or oozing any abnormal discharge. Lac appears clean.   Neurological:     Mental Status: He is alert and oriented to person, place, and time.     Gait: Gait normal.  Psychiatric:        Mood and Affect: Mood normal.        Behavior: Behavior normal.    Today's Vitals   08/03/19 1045  BP: 122/86  Pulse: 96  Temp: (!) 96.9 F (36.1 C)  TempSrc: Temporal  SpO2: 98%  Weight: 262 lb 3.2 oz (118.9 kg)   Body mass index is 36.06 kg/m.    Assessment & Plan:    Shin injury, left, initial encounter - Plan: cephALEXin (KEFLEX) 500 MG capsule  Laceration of skin of left lower leg, initial encounter - Plan: cephALEXin (KEFLEX) 500 MG capsule  Need for Tdap vaccination - Plan: Tdap vaccine greater than or equal to 7yo IM  Patient will continue current wound care process.  Wash skin daily with soap and water, pat dry.  He will apply thin layer bacitracin and cover area with bandage.  He will do this  for at least the next 5 days and then by the end of this week, that should be dried up and after he can leave wound open to air.  We will cover him with Keflex to prevent any sort of infection.  We will get tetanus booster due to being caught by a metal object.  He will let us know if anything worsens, he develops redness, discharge from wound.  Otherwise he will follow-up with PCP as regularly scheduled.

## 2019-08-26 ENCOUNTER — Ambulatory Visit: Payer: Managed Care, Other (non HMO) | Admitting: Urology

## 2019-09-04 ENCOUNTER — Other Ambulatory Visit: Payer: Self-pay | Admitting: Urology

## 2019-09-04 DIAGNOSIS — R7989 Other specified abnormal findings of blood chemistry: Secondary | ICD-10-CM

## 2019-09-04 MED ORDER — CLOMIPHENE CITRATE 50 MG PO TABS: 50.0000 mg | ORAL_TABLET | ORAL | 3 refills | Status: DC

## 2019-09-04 NOTE — Telephone Encounter (Signed)
Patient has a follow up in Jan 2021 and will keep

## 2019-09-04 NOTE — Telephone Encounter (Signed)
Pt needs refill Clomid

## 2019-09-04 NOTE — Telephone Encounter (Signed)
Ok to refill 

## 2019-09-04 NOTE — Telephone Encounter (Signed)
Ok to refill - but he needs an OV in Jan or Feb 2021 with a testosterone, hematocrit and PSA prior. Thanks.

## 2019-10-09 ENCOUNTER — Other Ambulatory Visit: Payer: Self-pay | Admitting: Urology

## 2019-10-09 DIAGNOSIS — R7989 Other specified abnormal findings of blood chemistry: Secondary | ICD-10-CM

## 2019-10-09 MED ORDER — CLOMIPHENE CITRATE 50 MG PO TABS: 50.0000 mg | ORAL_TABLET | ORAL | 3 refills | Status: DC

## 2019-10-09 NOTE — Telephone Encounter (Signed)
Ok to refill 

## 2019-10-09 NOTE — Telephone Encounter (Signed)
Pt is completely out of his Clomid and has appt on 01/22 - pt asks for meds to get him until his appt to be sent to [facility] on [address]. Please advise.

## 2019-10-16 ENCOUNTER — Ambulatory Visit: Payer: Managed Care, Other (non HMO) | Admitting: Urology

## 2019-10-26 ENCOUNTER — Ambulatory Visit: Payer: Managed Care, Other (non HMO) | Attending: Internal Medicine

## 2019-10-26 DIAGNOSIS — Z20822 Contact with and (suspected) exposure to covid-19: Secondary | ICD-10-CM

## 2019-10-27 LAB — NOVEL CORONAVIRUS, NAA: SARS-CoV-2, NAA: NOT DETECTED

## 2019-10-30 ENCOUNTER — Ambulatory Visit: Payer: Managed Care, Other (non HMO) | Admitting: Urology

## 2019-11-13 ENCOUNTER — Encounter: Payer: Self-pay | Admitting: Urology

## 2019-11-13 ENCOUNTER — Other Ambulatory Visit: Payer: Self-pay

## 2019-11-13 ENCOUNTER — Other Ambulatory Visit: Payer: Self-pay | Admitting: Internal Medicine

## 2019-11-13 ENCOUNTER — Ambulatory Visit: Payer: Managed Care, Other (non HMO) | Admitting: Urology

## 2019-11-13 VITALS — BP 149/103 | HR 76 | Ht 71.5 in | Wt 260.0 lb

## 2019-11-13 DIAGNOSIS — R7989 Other specified abnormal findings of blood chemistry: Secondary | ICD-10-CM

## 2019-11-13 DIAGNOSIS — Z3009 Encounter for other general counseling and advice on contraception: Secondary | ICD-10-CM

## 2019-11-13 NOTE — Progress Notes (Signed)
11/13/2019 11:52 AM   Paul Silva 07-26-1986 [id number]  Referring provider: Burnard Hawthorne, FNP [address] [place],  [place] [address]  Chief Complaint  Patient presents with  . Hypogonadism    HPI:  F/u --   Low T - he presented with fatigue. He does shift work with the police. Symptoms since 2019 -- August 2019 testosterone was 295. A September 2019 testosterone was 226 with a hematocrit of 42. He started Androgel. He was on 1 gel pack per day. His T was 351 in Jan 2020 on T. He may have a little more energy. He is exercising. Libido is lower. His erections are OK. He has two kids age 68 yo and 14 yo. They may have more kids.He is studying for his masters.   He stopped T replacement and his T was 306, free T 9.4 in Mar 2020. He still notes some fatigue and low libido. He did lose 11 lbs with exercise. He needs to eat better.   They are considering more kids but also asked about vasectomy. They have two kids -- daughter age 58 yo and son age 4 yo. Currently using IUD. He doesn't want anymore kids but his wife might.   His T was 312, free 8.8 with LH 3.3 and prolactin 4.29 Apr 2019. He started clomiphene but didn't return for repeat labs. He takes clomiphene Mon and Thursday. His energy is certainly tied to the length of his shifts and how much sleep he gets. He asked about vasectomy.   PMH: Past Medical History:  Diagnosis Date  . Asthma   . Fracture of thumb   . Frequent headaches   . Migraines     Surgical History: No past surgical history on file.  Home Medications:  Allergies as of 11/13/2019   No Known Allergies     Medication List       Accurate as of November 13, 2019 11:52 AM. If you have any questions, ask your nurse or doctor.        albuterol 108 (90 Base) MCG/ACT inhaler Commonly known as: ProAir HFA Inhale 1 puff into the lungs every 6 (six) hours as needed for wheezing or shortness of breath.   cephALEXin 500 MG  capsule Commonly known as: KEFLEX Take 1 capsule (500 mg total) by mouth 4 (four) times daily.   clomiPHENE 50 MG tablet Commonly known as: CLOMID Take 1 tablet (50 mg total) by mouth See admin instructions. Take one tablet by mouth every Monday and Thursday   Ergocalciferol 50 MCG (2000 UT) Tabs Take 1 tablet by mouth daily.   escitalopram 10 MG tablet Commonly known as: LEXAPRO TAKE 1 & 1 2 (ONE & ONE HALF) TABLETS BY MOUTH ONCE DAILY   FLUoxetine 20 MG capsule Commonly known as: PROZAC Take 1 capsule (20 mg total) by mouth every morning.   hydrOXYzine 10 MG tablet Commonly known as: ATARAX/VISTARIL Take 1 tablet (10 mg total) by mouth 3 (three) times daily as needed for anxiety (or as needed for sleep). CAUSES DROWSINESS       Allergies: No Known Allergies  Family History: Family History  Problem Relation Age of Onset  . Hypertension Father   . Diabetes Father   . Prostate cancer Neg Hx   . Colon cancer Neg Hx     Social History:  reports that he has never smoked. He has never used smokeless tobacco. He reports previous alcohol use. He reports that he does not  use drugs.   Physical Exam: BP (!) 149/103   Pulse 76   Ht 5' 11.5" (1.816 m)   Wt 260 lb (117.9 kg)   BMI 35.76 kg/m   Constitutional:  Alert and oriented, No acute distress. HEENT: [place] AT, moist mucus membranes.  Trachea midline, no masses. Cardiovascular: No clubbing, cyanosis, or edema. Respiratory: Normal respiratory effort, no increased work of breathing. GI: Abdomen is soft, nontender, nondistended, no abdominal masses GU: No CVA tenderness Lymph: No cervical or inguinal lymphadenopathy. Skin: No rashes, bruises or suspicious lesions. Neurologic: Grossly intact, no focal deficits, moving all 4 extremities. Psychiatric: Normal mood and affect.  Laboratory Data: Lab Results  Component Value Date   WBC 2.9 (L) 05/20/2018   HGB 13.9 10/22/2018   HCT 42.3 10/22/2018   MCV 79.0 05/20/2018    PLT 159.0 05/20/2018    Lab Results  Component Value Date   CREATININE 1.15 05/20/2018    No results found for: PSA  Lab Results  Component Value Date   TESTOSTERONE 312 05/20/2019    Lab Results  Component Value Date   HGBA1C 5.9 01/20/2018    Urinalysis No results found for: COLORURINE, APPEARANCEUR, LABSPEC, PHURINE, GLUCOSEU, HGBUR, BILIRUBINUR, KETONESUR, PROTEINUR, UROBILINOGEN, NITRITE, LEUKOCYTESUR  No results found for: LABMICR, [place], RBCUA, LABEPIT, MUCUS, BACTERIA  Pertinent Imaging: n/a No results found for this or any previous visit. No results found for this or any previous visit. No results found for this or any previous visit. No results found for this or any previous visit. No results found for this or any previous visit. No results found for this or any previous visit. No results found for this or any previous visit. No results found for this or any previous visit.  Assessment & Plan:    Low T - on clomiphene - check labs  Vasectomy counseling - we discussed the nature r/b/a to vasectomy. Permanent nature and that it doesn't work immediately also discussed among other information. He will consider.   I will see back in 6 months    No follow-ups on file.  Festus Aloe, MD  [facility] [address] [place], [place] [address] [phone]

## 2019-11-13 NOTE — Patient Instructions (Signed)
Vasectomy Vasectomy is a procedure in which the tube that carries sperm from the testicle to the urethra (vas deferens) is tied. It may also be cut. The procedure blocks sperm from going through the vas deferens and penis during ejaculation. This ensures that sperm does not go into the vagina during sex. Vasectomy does not affect your sexual desire or performance, and does not prevent sexually transmitted diseases. Vasectomy is considered a permanent and very effective form of birth control (contraception). The decision to have a vasectomy should not be made during a stressful situation, such as after the loss of a pregnancy or a divorce. You and your partner should make the decision to have a vasectomy when you are sure that you do not want children in the future. Tell a health care provider about:  Any allergies you have.  All medicines you are taking, including vitamins, herbs, eye drops, creams, and over-the-counter medicines.  Any problems you or family members have had with anesthetic medicines.  Any blood disorders you have.  Any surgeries you have had.  Any medical conditions you have. What are the risks? Generally, this is a safe procedure. However, problems may occur, including:  Infection.  Bleeding and swelling of the scrotum.  Allergic reactions to medicines.  Failure of the procedure to prevent pregnancy. There is a very small chance that the cut ends of the vas deferens may reconnect (recanalization), meaning that you could still make a woman pregnant.  Pain in the scrotum that continues after healing from the procedure. What happens before the procedure?  Ask your health care provider about: ? Changing or stopping your regular medicines. This is especially important if you are taking diabetes medicines or blood thinners. ? Taking over-the-counter medicines, vitamins, herbs, and supplements. ? Taking medicines such as aspirin and ibuprofen. These medicines can thin  your blood. Do not take these medicines unless your health care provider tells you to take them.  You may be asked to shower with a germ-killing soap.  Plan to have someone take you home from the hospital or clinic. What happens during the procedure?   To lower your risk of infection: ? Your health care team will wash or sanitize their hands. ? Hair may be removed from the surgical area. ? Your scrotum will be washed with soap.  You will be given one or more of the following: ? A medicine to help you relax (sedative). You may be instructed to take this a few hours before the procedure. ? A medicine to numb the area (local anesthetic).  Your health care provider will feel (palpate) for your vas deferens.  To reach the vas deferens, one of two methods may be used: ? A very small incision may be made in your scrotum. ? A punctured opening may be made in your scrotum, without an incision.  Your vas deferens will be pulled out of your scrotum, and may be: ? Tied off. ? Cut and possibly burned (cauterized) at the ends to seal them off.  The vas deferens will be put back into your scrotum.  The incision or puncture opening will be closed with absorbable stitches (sutures). The sutures will eventually dissolve and will not need to be removed after the procedure. The procedure may vary among health care providers and hospitals. What happens after the procedure?  You will be monitored to make sure that you do not experience problems.  You will be asked not to ejaculate for at least 1 week after   the procedure, or as long as directed.  You will need to use a different form of contraception for 2-4 months after the procedure, until you have test results confirming that there are no sperm in your semen.  You may be given scrotal support to wear, such as a jock strap or underwear with a supportive pouch.  Do not drive for 24 hours if you were given a sedative to help you  relax. Summary  Vasectomy is considered a permanent and very effective form of birth control (contraception). The procedure prevents sperm from being released during ejaculation.  Your scrotum will be numbed with medicine (local anesthetic) for the procedure.  After the procedure, you will be asked not to ejaculate for at least 1 week, or for as long as directed. You will also need to use a different form of contraception until your health care provider examines you and finds that there are no sperm in your semen. This information is not intended to replace advice given to you by your health care provider. Make sure you discuss any questions you have with your health care provider. Document Revised: 03/14/2018 Document Reviewed: 12/07/2016 Elsevier Patient Education  2020 Elsevier Inc.  

## 2019-11-14 LAB — HEMOGLOBIN AND HEMATOCRIT, BLOOD
Hematocrit: 44.8 % (ref 37.5–51.0)
Hemoglobin: 14.7 g/dL (ref 13.0–17.7)

## 2019-11-14 LAB — TESTOSTERONE,FREE AND TOTAL
Testosterone, Free: 24 pg/mL (ref 8.7–25.1)
Testosterone: 624 ng/dL (ref 264–916)

## 2019-11-18 ENCOUNTER — Telehealth: Payer: Self-pay | Admitting: *Deleted

## 2019-11-18 NOTE — Telephone Encounter (Signed)
Notified patient as instructed, patient pleased. Discussed follow-up appointments, patient agrees  

## 2019-11-18 NOTE — Telephone Encounter (Signed)
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 All the way up to 600+. Cont clomiphene twice weekly.  ----- Message ----- From: Chrystie Nose, CMA Sent: 11/16/2019   7:29 AM EST To: Festus Aloe, MD   ----- Message ----- From: Lavone Neri Lab Results In Sent: 11/14/2019   7:37 AM EST To: [facility]

## 2020-02-15 ENCOUNTER — Other Ambulatory Visit: Payer: Self-pay | Admitting: Family Medicine

## 2020-02-15 DIAGNOSIS — R7989 Other specified abnormal findings of blood chemistry: Secondary | ICD-10-CM

## 2020-02-15 MED ORDER — CLOMIPHENE CITRATE 50 MG PO TABS: 50.0000 mg | ORAL_TABLET | ORAL | 3 refills | Status: DC

## 2020-06-08 ENCOUNTER — Other Ambulatory Visit: Payer: Self-pay | Admitting: Urology

## 2020-06-08 DIAGNOSIS — R7989 Other specified abnormal findings of blood chemistry: Secondary | ICD-10-CM

## 2020-06-08 NOTE — Telephone Encounter (Signed)
Pt needs refill of clomid to be sent to [facility] on [address] Please advise.

## 2020-06-09 ENCOUNTER — Ambulatory Visit: Payer: Managed Care, Other (non HMO) | Admitting: Dermatology

## 2020-06-10 ENCOUNTER — Other Ambulatory Visit: Payer: Self-pay

## 2020-06-10 DIAGNOSIS — R7989 Other specified abnormal findings of blood chemistry: Secondary | ICD-10-CM

## 2020-06-10 MED ORDER — CLOMIPHENE CITRATE 50 MG PO TABS: 50.0000 mg | ORAL_TABLET | ORAL | 0 refills | Status: DC

## 2020-06-10 NOTE — Telephone Encounter (Signed)
Lab appt made for 06/21/20. Office visit made for 06/24/20. Sent in RX to [facility]. Patient notified that he must come to appts in order to receive any additional refills.

## 2020-06-21 ENCOUNTER — Other Ambulatory Visit: Payer: Self-pay

## 2020-06-21 ENCOUNTER — Other Ambulatory Visit: Payer: Managed Care, Other (non HMO)

## 2020-06-21 DIAGNOSIS — R7989 Other specified abnormal findings of blood chemistry: Secondary | ICD-10-CM

## 2020-06-22 LAB — TESTOSTERONE: Testosterone: 457 ng/dL (ref 264–916)

## 2020-06-22 LAB — HEMATOCRIT: Hematocrit: 44.8 % (ref 37.5–51.0)

## 2020-06-22 LAB — PSA: Prostate Specific Ag, Serum: 0.4 ng/mL (ref 0.0–4.0)

## 2020-06-24 ENCOUNTER — Ambulatory Visit: Payer: Self-pay | Admitting: Urology

## 2020-07-07 ENCOUNTER — Ambulatory Visit: Payer: Managed Care, Other (non HMO) | Admitting: Dermatology

## 2020-07-08 ENCOUNTER — Other Ambulatory Visit: Payer: Self-pay

## 2020-07-08 ENCOUNTER — Ambulatory Visit (INDEPENDENT_AMBULATORY_CARE_PROVIDER_SITE_OTHER): Payer: Managed Care, Other (non HMO) | Admitting: Urology

## 2020-07-08 ENCOUNTER — Encounter: Payer: Self-pay | Admitting: Urology

## 2020-07-08 VITALS — BP 127/83 | HR 80 | Ht 71.5 in | Wt 262.0 lb

## 2020-07-08 DIAGNOSIS — R7989 Other specified abnormal findings of blood chemistry: Secondary | ICD-10-CM

## 2020-07-08 MED ORDER — CLOMIPHENE CITRATE 50 MG PO TABS: 50.0000 mg | ORAL_TABLET | ORAL | 1 refills | Status: DC

## 2020-07-08 NOTE — Progress Notes (Signed)
07/08/2020 8:50 AM   Paul Silva 16-Sep-1986 [id number]  Referring provider: Burnard Hawthorne, FNP [address] [place],  [place] [address]  Chief Complaint  Patient presents with  . Hypogonadism    HPI:  F/u --   Low T - he presented with fatigue. He does shift work with the police. Symptoms since 2019 --August 2019 testosterone was 295. A September 2019 testosterone was 226 with a hematocrit of 42. He started Androgel. Hewas on 1 gel pack per day.His T was 351 in Jan 2020 on T.He may have a little more energy. He is exercising. Libido is lower. His erections are OK.   He stopped T replacement and his T was 306, free T 9.4 in Mar 2020. He still notes some fatigue and low libido. He did lose 11 lbs with exercise. He needs to eat better.   His T was 312, free 8.8 with LH 3.3 and prolactin 4.29 Apr 2019. He started clomiphene but didn't return for repeat labs. He takes clomiphene Mon and Thursday. His energy is certainly tied to the length of his shifts and how much sleep he gets.  2) vasectomy counseling 08/20, 02/21 - They are considering more kids but also asked about vasectomy.They have two kids -- daughter age 22 yo and son age 47 yo.Currently using IUD. He doesn't want anymore kids but his wife might.   Paul Silva follows up for the above. His T was 457, PSA 0.4 and Hct 44.8 in Sept 2021. He continues clomiphene twice weekly. He rotates shifts between first and third.    PMH: Past Medical History:  Diagnosis Date  . Asthma   . Fracture of thumb   . Frequent headaches   . Migraines     Surgical History: No past surgical history on file.  Home Medications:  Allergies as of 07/08/2020   No Known Allergies     Medication List       Accurate as of July 08, 2020  8:50 AM. If you have any questions, ask your nurse or doctor.        STOP taking these medications   cephALEXin 500 MG capsule Commonly known as: KEFLEX Stopped by: Festus Aloe, MD     TAKE these medications   albuterol 108 (90 Base) MCG/ACT inhaler Commonly known as: ProAir HFA Inhale 1 puff into the lungs every 6 (six) hours as needed for wheezing or shortness of breath.   clomiPHENE 50 MG tablet Commonly known as: CLOMID Take 1 tablet (50 mg total) by mouth See admin instructions. Take one tablet by mouth every Monday and Thursday   Ergocalciferol 50 MCG (2000 UT) Tabs Take 1 tablet by mouth daily.   escitalopram 10 MG tablet Commonly known as: LEXAPRO TAKE 1 & 1 2 (ONE & ONE HALF) TABLETS BY MOUTH ONCE DAILY   FLUoxetine 20 MG capsule Commonly known as: PROZAC Take 1 capsule (20 mg total) by mouth every morning.   hydrOXYzine 10 MG tablet Commonly known as: ATARAX/VISTARIL Take 1 tablet (10 mg total) by mouth 3 (three) times daily as needed for anxiety (or as needed for sleep). CAUSES DROWSINESS       Allergies: No Known Allergies  Family History: Family History  Problem Relation Age of Onset  . Hypertension Father   . Diabetes Father   . Prostate cancer Neg Hx   . Colon cancer Neg Hx     Social History:  reports that he has never smoked. He has  never used smokeless tobacco. He reports previous alcohol use. He reports that he does not use drugs.   Physical Exam: BP 127/83 (BP Location: Left Arm, Patient Position: Sitting, Cuff Size: Large)   Pulse 80   Ht 5' 11.5" (1.816 m)   Wt 262 lb (118.8 kg)   BMI 36.03 kg/m   Constitutional:  Alert and oriented, No acute distress. HEENT: Paul Silva AT, moist mucus membranes.  Trachea midline, no masses. Cardiovascular: No clubbing, cyanosis, or edema. Respiratory: Normal respiratory effort, no increased work of breathing. GI: Abdomen is soft, nontender, nondistended, no abdominal masses GU: No CVA tenderness Lymph: No cervical or inguinal lymphadenopathy. Skin: No rashes, bruises or suspicious lesions. Neurologic: Grossly intact, no focal deficits, moving all 4 extremities. Psychiatric:  Normal mood and affect.  Laboratory Data: Lab Results  Component Value Date   WBC 2.9 (L) 05/20/2018   HGB 14.7 11/13/2019   HCT 44.8 06/21/2020   MCV 79.0 05/20/2018   PLT 159.0 05/20/2018    Lab Results  Component Value Date   CREATININE 1.15 05/20/2018    No results found for: PSA  Lab Results  Component Value Date   TESTOSTERONE 457 06/21/2020    Lab Results  Component Value Date   HGBA1C 5.9 01/20/2018    Urinalysis No results found for: COLORURINE, APPEARANCEUR, LABSPEC, PHURINE, GLUCOSEU, HGBUR, BILIRUBINUR, KETONESUR, PROTEINUR, UROBILINOGEN, NITRITE, LEUKOCYTESUR  No results found for: LABMICR, [place], RBCUA, LABEPIT, MUCUS, BACTERIA  Pertinent Imaging: N/a  No results found for this or any previous visit.  No results found for this or any previous visit.  No results found for this or any previous visit.  No results found for this or any previous visit.  No results found for this or any previous visit.  No results found for this or any previous visit.  No results found for this or any previous visit.  No results found for this or any previous visit.   Assessment & Plan:    Low T - doing well on clomiphene. He will continue. Clomiphene refilled.   No follow-ups on file.  Festus Aloe, MD  [facility] [address], [place], [place] [address] [phone]

## 2020-07-13 ENCOUNTER — Other Ambulatory Visit: Payer: Self-pay

## 2020-07-13 ENCOUNTER — Telehealth: Payer: Self-pay | Admitting: Urology

## 2020-07-13 DIAGNOSIS — R7989 Other specified abnormal findings of blood chemistry: Secondary | ICD-10-CM

## 2020-07-13 MED ORDER — CLOMIPHENE CITRATE 50 MG PO TABS: 50.0000 mg | ORAL_TABLET | ORAL | 1 refills | Status: DC

## 2020-07-13 NOTE — Telephone Encounter (Signed)
Patient was seen in the office with Dr. Junious Silk on 10/15.  A prescription for Clomiphene was sent to the [facility].    Patient called the office today stating that the pharmacy does not have the prescription.  I advised the patient that we were having some issues last week with prescriptions not transmitting correctly.   Please print and fax the prescription to the [facility] and advise the patient.

## 2020-07-13 NOTE — Telephone Encounter (Signed)
Resent prescription, notified patient.

## 2020-07-13 NOTE — Telephone Encounter (Signed)
Resent patients RX due to e-prescribing being down the day the original prescription was sent.

## 2020-09-29 ENCOUNTER — Other Ambulatory Visit: Payer: Self-pay

## 2020-09-29 ENCOUNTER — Ambulatory Visit: Payer: Managed Care, Other (non HMO) | Admitting: Dermatology

## 2020-09-29 DIAGNOSIS — L810 Postinflammatory hyperpigmentation: Secondary | ICD-10-CM

## 2020-09-29 DIAGNOSIS — D229 Melanocytic nevi, unspecified: Secondary | ICD-10-CM | POA: Diagnosis not present

## 2020-09-29 DIAGNOSIS — L821 Other seborrheic keratosis: Secondary | ICD-10-CM

## 2020-09-29 DIAGNOSIS — L811 Chloasma: Secondary | ICD-10-CM

## 2020-09-29 MED ORDER — MOMETASONE FUROATE 0.1 % EX CREA: TOPICAL_CREAM | CUTANEOUS | 2 refills | Status: DC

## 2020-09-29 MED ORDER — TACROLIMUS 0.1 % EX OINT: TOPICAL_OINTMENT | CUTANEOUS | 2 refills | Status: DC

## 2020-09-29 NOTE — Progress Notes (Signed)
   New Patient Visit  Subjective  Paul Silva is a 35 y.o. male who presents for the following: darker pigmentation  (At sites of mosquito bites on the arms - have been present for about 1 year. He also has darker pigmentation around his hairline where papules used to be. ).  The following portions of the chart were reviewed this encounter and updated as appropriate:   Tobacco  Allergies  Meds  Problems  Med Hx  Surg Hx  Fam Hx     Review of Systems:  No other skin or systemic complaints except as noted in HPI or Assessment and Plan.  Objective  Well appearing patient in no apparent distress; mood and affect are within normal limits.  A focused examination was performed including the face and arms. Relevant physical exam findings are noted in the Assessment and Plan.  Objective  B/L arms: Dyschromia and hyperpigmented macules.  Images          Objective  B/L temples/hairline: Darkening of the skin.  Images            Assessment & Plan  Postinflammatory hyperpigmentation B/L arms  Discussed with patient that hyperpigmentation will fade over time although it may take a while.  Start Mometasone cream M, W, F QHS - spot treat areas Start Protopic ointment on T, Th, Sat QHS - spot treat areas  mometasone (ELOCON) 0.1 % cream - B/L arms  tacrolimus (PROTOPIC) 0.1 % ointment - B/L arms  Melasma B/L temples/hairline  Start Skin Medicinals skin lightening mix: Hydroquinone 12%, Kojic Acid 6%, Vitamin C 1%, Niacinamide 2% cream QHS for no more than 3 months.  Melasma is a condition of persistent pigmented patches generally on the face, worse in summer due to higher UV exposure.  Oral estrogen containing BCPs or supplements can exacerbate condition.  Recommend daily broad spectrum tinted sunscreen SPF 30+ to face, preferably with Zinc or Titanium Dioxide. Discussed Rx topical bleaching creams (i.e. hydroquinone), OTC HelioCare supplement, chemical peels  (would need multiple for best result).   Seborrheic Keratoses - Stuck-on, waxy, tan-brown papules and plaques  - Discussed benign etiology and prognosis. - Observe - Call for any changes  Melanocytic Nevi - Tan-brown and/or pink-flesh-colored symmetric macules and papules - Benign appearing on exam today - Observation - Call clinic for new or changing moles - Recommend daily use of broad spectrum spf 30+ sunscreen to sun-exposed areas.   Return in about 3 months (around 12/28/2020) for PIPA and Melasma recheck .  Maylene Roes, CMA, am acting as scribe for Armida Sans, MD .  Documentation: I have reviewed the above documentation for accuracy and completeness, and I agree with the above.  Armida Sans, MD

## 2020-09-29 NOTE — Patient Instructions (Signed)
Instructions for [facility] Medications  One or more of your medications was sent to the [facility] mail order compounding pharmacy. You will receive an email from them and can purchase the medicine through that link. It will then be mailed to your home at the address you confirmed. If for any reason you do not receive an email from them, please check your spam [email or web address] If you still do not find the email, please let us know. [facility] phone number is [phone].   

## 2020-10-11 ENCOUNTER — Encounter: Payer: Self-pay | Admitting: Dermatology

## 2020-12-06 ENCOUNTER — Other Ambulatory Visit: Payer: Self-pay

## 2020-12-06 ENCOUNTER — Ambulatory Visit: Payer: Managed Care, Other (non HMO) | Admitting: Adult Health

## 2020-12-06 ENCOUNTER — Other Ambulatory Visit: Payer: Self-pay | Admitting: Adult Health

## 2020-12-06 ENCOUNTER — Encounter: Payer: Self-pay | Admitting: Adult Health

## 2020-12-06 VITALS — BP 134/96 | HR 79 | Resp 16 | Ht 71.0 in | Wt 290.4 lb

## 2020-12-06 DIAGNOSIS — F418 Other specified anxiety disorders: Secondary | ICD-10-CM

## 2020-12-06 DIAGNOSIS — Z1389 Encounter for screening for other disorder: Secondary | ICD-10-CM | POA: Diagnosis not present

## 2020-12-06 DIAGNOSIS — F419 Anxiety disorder, unspecified: Secondary | ICD-10-CM | POA: Diagnosis not present

## 2020-12-06 DIAGNOSIS — Z6841 Body Mass Index (BMI) 40.0 and over, adult: Secondary | ICD-10-CM

## 2020-12-06 DIAGNOSIS — E559 Vitamin D deficiency, unspecified: Secondary | ICD-10-CM | POA: Diagnosis not present

## 2020-12-06 DIAGNOSIS — G4700 Insomnia, unspecified: Secondary | ICD-10-CM

## 2020-12-06 DIAGNOSIS — R030 Elevated blood-pressure reading, without diagnosis of hypertension: Secondary | ICD-10-CM

## 2020-12-06 LAB — POCT URINALYSIS DIPSTICK
Bilirubin, UA: NEGATIVE
Blood, UA: NEGATIVE
Glucose, UA: NEGATIVE
Ketones, UA: NEGATIVE
Leukocytes, UA: NEGATIVE
Nitrite, UA: NEGATIVE
Protein, UA: NEGATIVE
Spec Grav, UA: 1.01 (ref 1.010–1.025)
Urobilinogen, UA: 0.2 E.U./dL
pH, UA: 7 (ref 5.0–8.0)

## 2020-12-06 MED ORDER — ALPRAZOLAM 0.25 MG PO TABS: 0.2500 mg | ORAL_TABLET | Freq: Three times a day (TID) | ORAL | 0 refills | Status: DC | PRN

## 2020-12-06 MED ORDER — BUPROPION HCL ER (XL) 300 MG PO TB24: 300.0000 mg | ORAL_TABLET | Freq: Every day | ORAL | 0 refills | Status: DC

## 2020-12-06 MED ORDER — HYDROXYZINE HCL 10 MG PO TABS: 10.0000 mg | ORAL_TABLET | Freq: Every evening | ORAL | 1 refills | Status: DC | PRN

## 2020-12-06 NOTE — Patient Instructions (Addendum)
Managing Anxiety, Adult After being diagnosed with an anxiety disorder, you may be relieved to know why you have felt or behaved a certain way. You may also feel overwhelmed about the treatment ahead and what it will mean for your life. With care and support, you can manage this condition and recover from it. How to manage lifestyle changes Managing stress and anxiety Stress is your body's reaction to life changes and events, both good and bad. Most stress will last just a few hours, but stress can be ongoing and can lead to more than just stress. Although stress can play a major role in anxiety, it is not the same as anxiety. Stress is usually caused by something external, such as a deadline, test, or competition. Stress normally passes after the triggering event has ended.  Anxiety is caused by something internal, such as imagining a terrible outcome or worrying that something will go wrong that will devastate you. Anxiety often does not go away even after the triggering event is over, and it can become long-term (chronic) worry. It is important to understand the differences between stress and anxiety and to manage your stress effectively so that it does not lead to an anxious response. Talk with your health care provider or a counselor to learn more about reducing anxiety and stress. He or she may suggest tension reduction techniques, such as:  Music therapy. This can include creating or listening to music that you enjoy and that inspires you.  Mindfulness-based meditation. This involves being aware of your normal breaths while not trying to control your breathing. It can be done while sitting or walking.  Centering prayer. This involves focusing on a word, phrase, or sacred image that means something to you and brings you peace.  Deep breathing. To do this, expand your stomach and inhale slowly through your nose. Hold your breath for 3-5 seconds. Then exhale slowly, letting your stomach muscles  relax.  Self-talk. This involves identifying thought patterns that lead to anxiety reactions and changing those patterns.  Muscle relaxation. This involves tensing muscles and then relaxing them. Choose a tension reduction technique that suits your lifestyle and personality. These techniques take time and practice. Set aside 5-15 minutes a day to do them. Therapists can offer counseling and training in these techniques. The training to help with anxiety may be covered by some insurance plans. Other things you can do to manage stress and anxiety include:  Keeping a stress/anxiety diary. This can help you learn what triggers your reaction and then learn ways to manage your response.  Thinking about how you react to certain situations. You may not be able to control everything, but you can control your response.  Making time for activities that help you relax and not feeling guilty about spending your time in this way.  Visual imagery and yoga can help you stay calm and relax.   Medicines Medicines can help ease symptoms. Medicines for anxiety include:  Anti-anxiety drugs.  Antidepressants. Medicines are often used as a primary treatment for anxiety disorder. Medicines will be prescribed by a health care provider. When used together, medicines, psychotherapy, and tension reduction techniques may be the most effective treatment. Relationships Relationships can play a big part in helping you recover. Try to spend more time connecting with trusted friends and family members. Consider going to couples counseling, taking family education classes, or going to family therapy. Therapy can help you and others better understand your condition. How to recognize changes in your   anxiety Everyone responds differently to treatment for anxiety. Recovery from anxiety happens when symptoms decrease and stop interfering with your daily activities at home or work. This may mean that you will start to:  Have  better concentration and focus. Worry will interfere less in your daily thinking.  Sleep better.  Be less irritable.  Have more energy.  Have improved memory. It is important to recognize when your condition is getting worse. Contact your health care provider if your symptoms interfere with home or work and you feel like your condition is not improving. Follow these instructions at home: Activity  Exercise. Most adults should do the following: ? Exercise for at least 150 minutes each week. The exercise should increase your heart rate and make you sweat (moderate-intensity exercise). ? Strengthening exercises at least twice a week.  Get the right amount and quality of sleep. Most adults need 7-9 hours of sleep each night. Lifestyle  Eat a healthy diet that includes plenty of vegetables, fruits, whole grains, low-fat dairy products, and lean protein. Do not eat a lot of foods that are high in solid fats, added sugars, or salt.  Make choices that simplify your life.  Do not use any products that contain nicotine or tobacco, such as cigarettes, e-cigarettes, and chewing tobacco. If you need help quitting, ask your health care provider.  Avoid caffeine, alcohol, and certain over-the-counter cold medicines. These may make you feel worse. Ask your pharmacist which medicines to avoid.   General instructions  Take over-the-counter and prescription medicines only as told by your health care provider.  Keep all follow-up visits as told by your health care provider. This is important. Where to find support You can get help and support from these sources:  Self-help groups.  Online and community organizations.  A trusted spiritual leader.  Couples counseling.  Family education classes.  Family therapy. Where to find more information You may find that joining a support group helps you deal with your anxiety. The following sources can help you locate counselors or support groups near  you:  Mental Health America: www.mentalhealthamerica.net  Anxiety and Depression Association of America (ADAA): www.adaa.org  National Alliance on Mental Illness (NAMI): www.nami.org Contact a health care provider if you:  Have a hard time staying focused or finishing daily tasks.  Spend many hours a day feeling worried about everyday life.  Become exhausted by worry.  Start to have headaches, feel tense, or have nausea.  Urinate more than normal.  Have diarrhea. Get help right away if you have:  A racing heart and shortness of breath.  Thoughts of hurting yourself or others. If you ever feel like you may hurt yourself or others, or have thoughts about taking your own life, get help right away. You can go to your nearest emergency department or call:  Your local emergency services (911 in the [place]  A suicide crisis helpline, such as the National Suicide Prevention Lifeline at 1-800-273-8255. This is open 24 hours a day. Summary  Taking steps to learn and use tension reduction techniques can help calm you and help prevent triggering an anxiety reaction.  When used together, medicines, psychotherapy, and tension reduction techniques may be the most effective treatment.  Family, friends, and partners can play a big part in helping you recover from an anxiety disorder. This information is not intended to replace advice given to you by your health care provider. Make sure you discuss any questions you have with your health care provider. Document   Revised: 02/10/2019 Document Reviewed: 02/10/2019 Elsevier Patient Education  2021 [place]. Bupropion extended-release tablets (Depression/Mood Disorders) What is this medicine? BUPROPION (byoo PROE pee on) is used to treat depression. This medicine may be used for other purposes; ask your health care provider or pharmacist if you have questions. COMMON BRAND NAME(S): Aplenzin, Budeprion XL, Forfivo XL, Wellbutrin XL What  should I tell my health care provider before I take this medicine? They need to know if you have any of these conditions:  an eating disorder, such as anorexia or bulimia  bipolar disorder or psychosis  diabetes or high blood sugar, treated with medication  glaucoma  head injury or brain tumor  heart disease, previous heart attack, or irregular heart beat  high blood pressure  kidney or liver disease  seizures (convulsions)  suicidal thoughts or a previous suicide attempt  Tourette's syndrome  weight loss  an unusual or allergic reaction to bupropion, other medicines, foods, dyes, or preservatives  breast-feeding  pregnant or trying to become pregnant How should I use this medicine? Take this medicine by mouth with a glass of water. Follow the directions on the prescription label. You can take it with or without food. If it upsets your stomach, take it with food. Do not crush, chew, or cut these tablets. This medicine is taken once daily at the same time each day. Do not take your medicine more often than directed. Do not stop taking this medicine suddenly except upon the advice of your doctor. Stopping this medicine too quickly may cause serious side effects or your condition may worsen. A special MedGuide will be given to you by the pharmacist with each prescription and refill. Be sure to read this information carefully each time. Talk to your pediatrician regarding the use of this medicine in children. Special care may be needed. Overdosage: If you think you have taken too much of this medicine contact a poison control center or emergency room at once. NOTE: This medicine is only for you. Do not share this medicine with others. What if I miss a dose? If you miss a dose, skip the missed dose and take your next tablet at the regular time. Do not take double or extra doses. What may interact with this medicine? Do not take this medicine with any of the following  medications:  linezolid  MAOIs like Azilect, Carbex, Eldepryl, Marplan, Nardil, and Parnate  methylene blue (injected into a vein)  other medicines that contain bupropion like Zyban This medicine may also interact with the following medications:  alcohol  certain medicines for anxiety or sleep  certain medicines for blood pressure like metoprolol, propranolol  certain medicines for depression or psychotic disturbances  certain medicines for HIV or AIDS like efavirenz, lopinavir, nelfinavir, ritonavir  certain medicines for irregular heart beat like propafenone, flecainide  certain medicines for Parkinson's disease like amantadine, levodopa  certain medicines for seizures like carbamazepine, phenytoin, phenobarbital  cimetidine  clopidogrel  cyclophosphamide  digoxin  furazolidone  isoniazid  nicotine  orphenadrine  procarbazine  steroid medicines like prednisone or cortisone  stimulant medicines for attention disorders, weight loss, or to stay awake  tamoxifen  theophylline  thiotepa  ticlopidine  tramadol  warfarin This list may not describe all possible interactions. Give your health care provider a list of all the medicines, herbs, non-prescription drugs, or dietary supplements you use. Also tell them if you smoke, drink alcohol, or use illegal drugs. Some items may interact with your medicine. What should I  watch for while using this medicine? Tell your doctor if your symptoms do not get better or if they get worse. Visit your doctor or healthcare provider for regular checks on your progress. Because it may take several weeks to see the full effects of this medicine, it is important to continue your treatment as prescribed by your doctor. This medicine may cause serious skin reactions. They can happen weeks to months after starting the medicine. Contact your healthcare provider right away if you notice fevers or flu-like symptoms with a rash. The  rash may be red or purple and then turn into blisters or peeling of the skin. Or, you might notice a red rash with swelling of the face, lips or lymph nodes in your neck or under your arms. Patients and their families should watch out for new or worsening thoughts of suicide or depression. Also watch out for sudden changes in feelings such as feeling anxious, agitated, panicky, irritable, hostile, aggressive, impulsive, severely restless, overly excited and hyperactive, or not being able to sleep. If this happens, especially at the beginning of treatment or after a change in dose, call your healthcare provider. Avoid alcoholic drinks while taking this medicine. Drinking large amounts of alcoholic beverages, using sleeping or anxiety medicines, or quickly stopping the use of these agents while taking this medicine may increase your risk for a seizure. Do not drive or use heavy machinery until you know how this medicine affects you. This medicine can impair your ability to perform these tasks. Do not take this medicine close to bedtime. It may prevent you from sleeping. Your mouth may get dry. Chewing sugarless gum or sucking hard candy, and drinking plenty of water may help. Contact your doctor if the problem does not go away or is severe. The tablet shell for some brands of this medicine does not dissolve. This is normal. The tablet shell may appear whole in the stool. This is not a cause for concern. What side effects may I notice from receiving this medicine? Side effects that you should report to your doctor or health care professional as soon as possible:  allergic reactions like skin rash, itching or hives, swelling of the face, lips, or tongue  breathing problems  changes in vision  confusion  elevated mood, decreased need for sleep, racing thoughts, impulsive behavior  fast or irregular heartbeat  hallucinations, loss of contact with reality  increased blood pressure  rash, fever,  and swollen lymph nodes  redness, blistering, peeling or loosening of the skin, including inside the mouth  seizures  suicidal thoughts or other mood changes  unusually weak or tired  vomiting Side effects that usually do not require medical attention (report to your doctor or health care professional if they continue or are bothersome):  constipation  headache  loss of appetite  nausea  tremors  weight loss This list may not describe all possible side effects. Call your doctor for medical advice about side effects. You may report side effects to FDA at 1-800-FDA-1088. Where should I keep my medicine? Keep out of the reach of children. Store at room temperature between 15 and 30 degrees C (59 and 86 degrees F). Throw away any unused medicine after the expiration date. NOTE: This sheet is a summary. It may not cover all possible information. If you have questions about this medicine, talk to your doctor, pharmacist, or health care provider.  2021 Elsevier/Gold Standard (2018-12-04 13:45:31)   Calorie Counting for Weight Loss Calories are units  of energy. Your body needs a certain number of calories from food to keep going throughout the day. When you eat or drink more calories than your body needs, your body stores the extra calories mostly as fat. When you eat or drink fewer calories than your body needs, your body burns fat to get the energy it needs. Calorie counting means keeping track of how many calories you eat and drink each day. Calorie counting can be helpful if you need to lose weight. If you eat fewer calories than your body needs, you should lose weight. Ask your health care provider what a healthy weight is for you. For calorie counting to work, you will need to eat the right number of calories each day to lose a healthy amount of weight per week. A dietitian can help you figure out how many calories you need in a day and will suggest ways to reach your calorie  goal.  A healthy amount of weight to lose each week is usually 1-2 lb (0.5-0.9 kg). This usually means that your daily calorie intake should be reduced by 500-750 calories.  Eating 1,200-1,500 calories a day can help most women lose weight.  Eating 1,500-1,800 calories a day can help most men lose weight. What do I need to know about calorie counting? Work with your health care provider or dietitian to determine how many calories you should get each day. To meet your daily calorie goal, you will need to:  Find out how many calories are in each food that you would like to eat. Try to do this before you eat.  Decide how much of the food you plan to eat.  Keep a food log. Do this by writing down what you ate and how many calories it had. To successfully lose weight, it is important to balance calorie counting with a healthy lifestyle that includes regular activity. Where do I find calorie information? The number of calories in a food can be found on a Nutrition Facts label. If a food does not have a Nutrition Facts label, try to look up the calories online or ask your dietitian for help. Remember that calories are listed per serving. If you choose to have more than one serving of a food, you will have to multiply the calories per serving by the number of servings you plan to eat. For example, the label on a package of bread might say that a serving size is 1 slice and that there are 90 calories in a serving. If you eat 1 slice, you will have eaten 90 calories. If you eat 2 slices, you will have eaten 180 calories.   How do I keep a food log? After each time that you eat, record the following in your food log as soon as possible:  What you ate. Be sure to include toppings, sauces, and other extras on the food.  How much you ate. This can be measured in cups, ounces, or number of items.  How many calories were in each food and drink.  The total number of calories in the food you ate. Keep  your food log near you, such as in a pocket-sized notebook or on an app or website on your mobile phone. Some programs will calculate calories for you and show you how many calories you have left to meet your daily goal. What are some portion-control tips?  Know how many calories are in a serving. This will help you know how many servings you  can have of a certain food.  Use a measuring cup to measure serving sizes. You could also try weighing out portions on a kitchen scale. With time, you will be able to estimate serving sizes for some foods.  Take time to put servings of different foods on your favorite plates or in your favorite bowls and cups so you know what a serving looks like.  Try not to eat straight from a food's packaging, such as from a bag or box. Eating straight from the package makes it hard to see how much you are eating and can lead to overeating. Put the amount you would like to eat in a cup or on a plate to make sure you are eating the right portion.  Use smaller plates, glasses, and bowls for smaller portions and to prevent overeating.  Try not to multitask. For example, avoid watching TV or using your computer while eating. If it is time to eat, sit down at a table and enjoy your food. This will help you recognize when you are full. It will also help you be more mindful of what and how much you are eating. What are tips for following this plan? Reading food labels  Check the calorie count compared with the serving size. The serving size may be smaller than what you are used to eating.  Check the source of the calories. Try to choose foods that are high in protein, fiber, and vitamins, and low in saturated fat, trans fat, and sodium. Shopping  Read nutrition labels while you shop. This will help you make healthy decisions about which foods to buy.  Pay attention to nutrition labels for low-fat or fat-free foods. These foods sometimes have the same number of calories or  more calories than the full-fat versions. They also often have added sugar, starch, or salt to make up for flavor that was removed with the fat.  Make a grocery list of lower-calorie foods and stick to it. Cooking  Try to cook your favorite foods in a healthier way. For example, try baking instead of frying.  Use low-fat dairy products. Meal planning  Use more fruits and vegetables. One-half of your plate should be fruits and vegetables.  Include lean proteins, such as chicken, [place], and fish. Lifestyle Each week, aim to do one of the following:  150 minutes of moderate exercise, such as walking.  75 minutes of vigorous exercise, such as running. General information  Know how many calories are in the foods you eat most often. This will help you calculate calorie counts faster.  Find a way of tracking calories that works for you. Get creative. Try different apps or programs if writing down calories does not work for you. What foods should I eat?  Eat nutritious foods. It is better to have a nutritious, high-calorie food, such as an avocado, than a food with few nutrients, such as a bag of potato chips.  Use your calories on foods and drinks that will fill you up and will not leave you hungry soon after eating. ? Examples of foods that fill you up are nuts and nut butters, vegetables, lean proteins, and high-fiber foods such as whole grains. High-fiber foods are foods with more than 5 g of fiber per serving.  Pay attention to calories in drinks. Low-calorie drinks include water and unsweetened drinks. The items listed above may not be a complete list of foods and beverages you can eat. Contact a dietitian for more information.  What foods should I limit? Limit foods or drinks that are not good sources of vitamins, minerals, or protein or that are high in unhealthy fats. These include:  Candy.  Other sweets.  Sodas, specialty coffee drinks, alcohol, and juice. The items  listed above may not be a complete list of foods and beverages you should avoid. Contact a dietitian for more information. How do I count calories when eating out?  Pay attention to portions. Often, portions are much larger when eating out. Try these tips to keep portions smaller: ? Consider sharing a meal instead of getting your own. ? If you get your own meal, eat only half of it. Before you start eating, ask for a container and put half of your meal into it. ? When available, consider ordering smaller portions from the menu instead of full portions.  Pay attention to your food and drink choices. Knowing the way food is cooked and what is included with the meal can help you eat fewer calories. ? If calories are listed on the menu, choose the lower-calorie options. ? Choose dishes that include vegetables, fruits, whole grains, low-fat dairy products, and lean proteins. ? Choose items that are boiled, broiled, grilled, or steamed. Avoid items that are buttered, battered, fried, or served with cream sauce. Items labeled as crispy are usually fried, unless stated otherwise. ? Choose water, low-fat milk, unsweetened iced tea, or other drinks without added sugar. If you want an alcoholic beverage, choose a lower-calorie option, such as a glass of wine or light beer. ? Ask for dressings, sauces, and syrups on the side. These are usually high in calories, so you should limit the amount you eat. ? If you want a salad, choose a garden salad and ask for grilled meats. Avoid extra toppings such as bacon, cheese, or fried items. Ask for the dressing on the side, or ask for olive oil and vinegar or lemon to use as dressing.  Estimate how many servings of a food you are given. Knowing serving sizes will help you be aware of how much food you are eating at restaurants. Where to find more information  Centers for Disease Control and Prevention: http://www.wolf.info/  [place] Department of Agriculture:  http://www.wilson-mendoza.org/ Summary  Calorie counting means keeping track of how many calories you eat and drink each day. If you eat fewer calories than your body needs, you should lose weight.  A healthy amount of weight to lose per week is usually 1-2 lb (0.5-0.9 kg). This usually means reducing your daily calorie intake by 500-750 calories.  The number of calories in a food can be found on a Nutrition Facts label. If a food does not have a Nutrition Facts label, try to look up the calories online or ask your dietitian for help.  Use smaller plates, glasses, and bowls for smaller portions and to prevent overeating.  Use your calories on foods and drinks that will fill you up and not leave you hungry shortly after a meal. This information is not intended to replace advice given to you by your health care provider. Make sure you discuss any questions you have with your health care provider. Document Revised: 10/22/2019 Document Reviewed: 10/22/2019 Elsevier Patient Education  2021 [place] and Cholesterol Restricted Eating Plan Getting too much fat and cholesterol in your diet may cause health problems. Choosing the right foods helps keep your fat and cholesterol at normal levels. This can keep you from getting certain diseases. Your doctor  may recommend an eating plan that includes:  Total fat: ______% or less of total calories a day.  Saturated fat: ______% or less of total calories a day.  Cholesterol: less than _________mg a day.  Fiber: ______g a day. What are tips for following this plan? Meal planning  At meals, divide your plate into four equal parts: ? Fill one-half of your plate with vegetables and green salads. ? Fill one-fourth of your plate with whole grains. ? Fill one-fourth of your plate with low-fat (lean) protein foods.  Eat fish that is high in omega-3 fats at least two times a week. This includes mackerel, tuna, sardines, and salmon.  Eat foods that are high in  fiber, such as whole grains, beans, apples, broccoli, carrots, peas, and barley. General tips  Work with your doctor to lose weight if you need to.  Avoid: ? Foods with added sugar. ? Fried foods. ? Foods with partially hydrogenated oils.  Limit alcohol intake to no more than 1 drink a day for nonpregnant women and 2 drinks a day for men. One drink equals 12 oz of beer, 5 oz of wine, or 1 oz of hard liquor.   Reading food labels  Check food labels for: ? Trans fats. ? Partially hydrogenated oils. ? Saturated fat (g) in each serving. ? Cholesterol (mg) in each serving. ? Fiber (g) in each serving.  Choose foods with healthy fats, such as: ? Monounsaturated fats. ? Polyunsaturated fats. ? Omega-3 fats.  Choose grain products that have whole grains. Look for the word "whole" as the first word in the ingredient list. Cooking  Cook foods using low-fat methods. These include baking, boiling, grilling, and broiling.  Eat more home-cooked foods. Eat at restaurants and buffets less often.  Avoid cooking using saturated fats, such as butter, cream, palm oil, palm kernel oil, and coconut oil. Recommended foods Fruits  All fresh, canned (in natural juice), or frozen fruits. Vegetables  Fresh or frozen vegetables (raw, steamed, roasted, or grilled). Green salads. Grains  Whole grains, such as whole wheat or whole grain breads, crackers, cereals, and pasta. Unsweetened oatmeal, bulgur, barley, quinoa, or brown rice. Corn or whole wheat flour tortillas. Meats and other protein foods  Ground beef (85% or leaner), grass-fed beef, or beef trimmed of fat. Skinless chicken or [place]. Ground chicken or [place]. Pork trimmed of fat. All fish and seafood. Egg whites. Dried beans, peas, or lentils. Unsalted nuts or seeds. Unsalted canned beans. Nut butters without added sugar or oil. Dairy  Low-fat or nonfat dairy products, such as skim or 1% milk, 2% or reduced-fat cheeses, low-fat and  fat-free ricotta or cottage cheese, or plain low-fat and nonfat yogurt. Fats and oils  Tub margarine without trans fats. Light or reduced-fat mayonnaise and salad dressings. Avocado. Olive, canola, sesame, or safflower oils. The items listed above may not be a complete list of foods and beverages you can eat. Contact a dietitian for more information.   Foods to avoid Fruits  Canned fruit in heavy syrup. Fruit in cream or butter sauce. Fried fruit. Vegetables  Vegetables cooked in cheese, cream, or butter sauce. Fried vegetables. Grains  White bread. White pasta. White rice. Cornbread. Bagels, pastries, and croissants. Crackers and snack foods that contain trans fat and hydrogenated oils. Meats and other protein foods  Fatty cuts of meat. Ribs, chicken wings, bacon, sausage, bologna, salami, chitterlings, fatback, hot dogs, bratwurst, and packaged lunch meats. Liver and organ meats. Whole eggs and egg yolks. Chicken  and [place] with skin. Fried meat. Dairy  Whole or 2% milk, cream, half-and-half, and cream cheese. Whole milk cheeses. Whole-fat or sweetened yogurt. Full-fat cheeses. Nondairy creamers and whipped toppings. Processed cheese, cheese spreads, and cheese curds. Beverages  Alcohol. Sugar-sweetened drinks such as sodas, lemonade, and fruit drinks. Fats and oils  Butter, stick margarine, lard, shortening, ghee, or bacon fat. Coconut, palm kernel, and palm oils. Sweets and desserts  Corn syrup, sugars, honey, and molasses. Candy. Jam and jelly. Syrup. Sweetened cereals. Cookies, pies, cakes, donuts, muffins, and ice cream. The items listed above may not be a complete list of foods and beverages you should avoid. Contact a dietitian for more information. Summary  Choosing the right foods helps keep your fat and cholesterol at normal levels. This can keep you from getting certain diseases.  At meals, fill one-half of your plate with vegetables and green salads.  Eat high-fiber  foods, like whole grains, beans, apples, carrots, peas, and barley.  Limit added sugar, saturated fats, alcohol, and fried foods. This information is not intended to replace advice given to you by your health care provider. Make sure you discuss any questions you have with your health care provider. Document Revised: 01/13/2020 Document Reviewed: 01/13/2020 Elsevier Patient Education  2021 [employer].

## 2020-12-06 NOTE — Progress Notes (Signed)
New patient visit   Patient: Paul Silva   DOB: Feb 12, 1986   35 y.o. Male  MRN: [id number] Visit Date: 12/06/2020  Today's healthcare provider: Marcille Buffy, FNP   Chief Complaint  Patient presents with  . New Patient (Initial Visit)   Subjective    [name] is a 35 y.o. male who presents today as a new patient to establish care.  HPI  Patient presents in office today to establish care he states that he feels well today but does have concerns to address. Patient works as a Engineer, structural he states that he follows a general diet, he is actively exercising and reports that his sleep habits are poor averaging 4hrs of sleep a night. He gets off work at 1 am and sleeps only 4 hours and goes to the gym.  He takes  Hydroxyzine at [facility] for sleep/ anxiety. He only takes when going to bed as it makes him sleepy.  He is getting ready to go on a flight- he has never flown and has anxiety.   Patient would like to address symptoms of anxiety, patient reports that he was previously treated in past for anxiety and was on hydroxyzine to help with sleep. Patient reports having racing thoughts at times and states that he does worry.   He see's endocrinology for history of low testosterone.  He is not on Clomid now. Off testosterone.   He took Lexapro without much improvement felt better on Prozac but not currently taking.He felt better on Prozac.    Patient  denies any fever, body aches,chills, rash, chest pain, shortness of breath, nausea, vomiting, or diarrhea.  Denies dizziness, lightheadedness, pre syncopal or syncopal episodes.    Past Medical History:  Diagnosis Date  . Allergy   . Anxiety   . Asthma   . Fracture of thumb   . Frequent headaches   . Migraines    History reviewed. No pertinent surgical history. Family Status  Relation Name Status  . Father  (Not Specified)  . Mother  (Not Specified)  . Neg Hx  (Not Specified)   Family History  Problem Relation  Age of Onset  . Hypertension Father   . Diabetes Father   . Diabetes Mother   . Prostate cancer Neg Hx   . Colon cancer Neg Hx    Social History   Socioeconomic History  . Marital status: Married    Spouse name: Not on file  . Number of children: Not on file  . Years of education: Not on file  . Highest education level: Not on file  Occupational History  . Not on file  Tobacco Use  . Smoking status: Never Smoker  . Smokeless tobacco: Never Used  Vaping Use  . Vaping Use: Never used  Substance and Sexual Activity  . Alcohol use: Not Currently    Alcohol/week: 0.0 standard drinks  . Drug use: No  . Sexual activity: Yes  Other Topics Concern  . Not on file  Social History Narrative   Librarian, academic- homeland security   Social Determinants of Health   Financial Resource Strain: Not on file  Food Insecurity: Not on file  Transportation Needs: Not on file  Physical Activity: Not on file  Stress: Not on file  Social Connections: Not on file   Outpatient Medications Prior to Visit  Medication Sig  . albuterol (PROAIR HFA) 108 (90 Base) MCG/ACT inhaler Inhale 1 puff into the lungs  every 6 (six) hours as needed for wheezing or shortness of breath. (Patient not taking: Reported on 09/29/2020)  . clomiPHENE (CLOMID) 50 MG tablet Take 1 tablet (50 mg total) by mouth See admin instructions. Take one tablet by mouth every Monday and Thursday  . Ergocalciferol 2000 units TABS Take 1 tablet by mouth daily.  . mometasone (ELOCON) 0.1 % cream Apply to the affected areas of the arms QHS on Monday, Wednesday, and Friday.  . tacrolimus (PROTOPIC) 0.1 % ointment Apply to affected areas on the arms QHS on Tuesday, Thursday, and Saturday.  . [DISCONTINUED] escitalopram (LEXAPRO) 10 MG tablet TAKE 1 & 1 2 (ONE & ONE HALF) TABLETS BY MOUTH ONCE DAILY (Patient not taking: Reported on 09/29/2020)  . [DISCONTINUED] FLUoxetine (PROZAC) 20 MG capsule Take 1 capsule (20 mg total) by  mouth every morning. (Patient not taking: Reported on 09/29/2020)  . [DISCONTINUED] hydrOXYzine (ATARAX/VISTARIL) 10 MG tablet Take 1 tablet (10 mg total) by mouth 3 (three) times daily as needed for anxiety (or as needed for sleep). CAUSES DROWSINESS (Patient not taking: Reported on 09/29/2020)   No facility-administered medications prior to visit.   No Known Allergies  Immunization History  Administered Date(s) Administered  . Tdap 01/15/2018, 08/03/2019    Health Maintenance  Topic Date Due  . COVID-19 Vaccine (1) 12/22/2020 (Originally 10/29/1999)  . INFLUENZA VACCINE  01/22/2021 (Originally 04/24/2020)  . TETANUS/TDAP  08/02/2029  . HIV Screening  Completed  . HPV VACCINES  Aged Out  . Hepatitis C Screening  Discontinued    Patient Care Team: Fritzie Prioleau, Kelby Aline, FNP as PCP - General (Family Medicine) Christene Lye, MD (General Surgery) Talmage Nap, PA-C as Physician Assistant (Physician Assistant)  Review of Systems  Constitutional: Positive for fatigue.  Neurological: Positive for headaches.  Psychiatric/Behavioral: Positive for agitation. The patient is nervous/anxious.   All other systems reviewed and are negative.   Last CBC Lab Results  Component Value Date   WBC 2.9 (L) 05/20/2018   HGB 14.7 11/13/2019   HCT 44.8 06/21/2020   MCV 79.0 05/20/2018   RDW 13.7 05/20/2018   PLT 159.0 82/42/3536   Last metabolic panel Lab Results  Component Value Date   GLUCOSE 90 05/20/2018   NA 135 05/20/2018   K 3.9 05/20/2018   CL 101 05/20/2018   CO2 28 05/20/2018   BUN 12 05/20/2018   CREATININE 1.15 05/20/2018   CALCIUM 10.1 05/20/2018   PROT 7.8 05/20/2018   ALBUMIN 4.6 05/20/2018   BILITOT 0.7 05/20/2018   ALKPHOS 63 05/20/2018   AST 19 05/20/2018   ALT 19 05/20/2018   Last lipids Lab Results  Component Value Date   CHOL 288 (H) 01/20/2018   HDL 47.70 01/20/2018   LDLCALC 214 (H) 01/20/2018   TRIG 132.0 01/20/2018   CHOLHDL 6 01/20/2018    Last hemoglobin A1c Lab Results  Component Value Date   HGBA1C 5.9 01/20/2018   Last thyroid functions Lab Results  Component Value Date   TSH 2.10 05/20/2018   Last vitamin D Lab Results  Component Value Date   VD25OH 23.35 (L) 05/20/2018   Last vitamin B12 and Folate Lab Results  Component Value Date   VITAMINB12 417 05/20/2018   FOLATE 20.7 05/20/2018    Objective    BP (!) 134/96   Pulse 79   Resp 16   Ht 5\' 11"  (1.803 m)   Wt 290 lb 6.4 oz (131.7 kg)   SpO2 100%   BMI 40.50  kg/m  Physical Exam Vitals and nursing note reviewed.  Constitutional:      General: He is not in acute distress.    Appearance: Normal appearance. He is well-developed. He is obese. He is not ill-appearing, toxic-appearing or diaphoretic.     Comments: Patient is alert and oriented and responsive to questions Engages in eye contact with provider. Speaks in full sentences without any pauses without any shortness of breath or distress.    HENT:     Head: Normocephalic and atraumatic.     Right Ear: Hearing, tympanic membrane, ear canal and external ear normal.     Left Ear: Hearing, tympanic membrane, ear canal and external ear normal.     Nose: Nose normal.     Mouth/Throat:     Pharynx: Uvula midline. No oropharyngeal exudate.  Eyes:     General: Lids are normal. No scleral icterus.       Right eye: No discharge.        Left eye: No discharge.     Conjunctiva/sclera: Conjunctivae normal.     Pupils: Pupils are equal, round, and reactive to light.  Neck:     Thyroid: No thyromegaly.     Vascular: Normal carotid pulses. No carotid bruit, hepatojugular reflux or JVD.     Trachea: Trachea and phonation normal. No tracheal tenderness or tracheal deviation.     Meningeal: Brudzinski's sign absent.  Cardiovascular:     Rate and Rhythm: Normal rate and regular rhythm.     Pulses: Normal pulses.     Heart sounds: Normal heart sounds, S1 normal and S2 normal. Heart sounds not distant. No  murmur heard. No friction rub. No gallop.   Pulmonary:     Effort: Pulmonary effort is normal. No accessory muscle usage or respiratory distress.     Breath sounds: Normal breath sounds. No stridor. No wheezing or rales.  Chest:     Chest wall: No tenderness.  Abdominal:     General: Bowel sounds are normal. There is no distension.     Palpations: Abdomen is soft. There is no mass.     Tenderness: There is no abdominal tenderness. There is no guarding or rebound.     Hernia: No hernia is present.  Musculoskeletal:        General: No tenderness or deformity. Normal range of motion.     Cervical back: Full passive range of motion without pain, normal range of motion and neck supple.     Comments: Patient moves on and off of exam table and in room without difficulty. Gait is normal in hall and in room. Patient is oriented to person place time and situation. Patient answers questions appropriately and engages in conversation.   Lymphadenopathy:     Head:     Right side of head: No submental, submandibular, tonsillar, preauricular, posterior auricular or occipital adenopathy.     Left side of head: No submental, submandibular, tonsillar, preauricular, posterior auricular or occipital adenopathy.     Cervical: No cervical adenopathy.  Skin:    General: Skin is warm and dry.     Capillary Refill: Capillary refill takes less than 2 seconds.     Coloration: Skin is not pale.     Findings: No erythema or rash.     Nails: There is no clubbing.  Neurological:     Mental Status: He is alert and oriented to person, place, and time.     GCS: GCS eye subscore is 4. GCS verbal subscore  is 5. GCS motor subscore is 6.     Cranial Nerves: No cranial nerve deficit.     Sensory: No sensory deficit.     Motor: No abnormal muscle tone.     Coordination: Coordination normal.     Gait: Gait normal.     Deep Tendon Reflexes: Reflexes are normal and symmetric. Reflexes normal.  Psychiatric:        Speech:  Speech normal.        Behavior: Behavior normal.        Thought Content: Thought content normal.        Judgment: Judgment normal.      Depression Screen PHQ 2/9 Scores 12/06/2020 05/13/2019 03/25/2019 12/18/2018  PHQ - 2 Score 1 0 0 0  PHQ- 9 Score 8 - 3 4   Results for orders placed or performed in visit on 12/06/20  POCT urinalysis dipstick  Result Value Ref Range   Color, UA yellow    Clarity, UA clear    Glucose, UA Negative Negative   Bilirubin, UA negative    Ketones, UA negative    Spec Grav, UA 1.010 1.010 - 1.025   Blood, UA negative    pH, UA 7.0 5.0 - 8.0   Protein, UA Negative Negative   Urobilinogen, UA 0.2 0.2 or 1.0 E.U./dL   Nitrite, UA negative    Leukocytes, UA Negative Negative   Appearance     Odor      Assessment & Plan        Anxiety - Plan: CBC with Differential/Platelet, Comprehensive metabolic panel, Lipid panel, TSH, hydrOXYzine (ATARAX/VISTARIL) 10 MG tablet  Screening for blood or protein in urine - Plan: POCT urinalysis dipstick  Insomnia, unspecified type - Plan: hydrOXYzine (ATARAX/VISTARIL) 10 MG tablet  Vitamin D deficiency - Plan: VITAMIN D 25 Hydroxy (Vit-D Deficiency, Fractures)  Situational anxiety - Plan: ALPRAZolam (XANAX) 0.25 MG tablet  Blood pressure elevated without history of HTN Xanax sent for flight anxiety discussed not combining with other medications.  Meds ordered this encounter  Medications  . DISCONTD: buPROPion (WELLBUTRIN XL) 300 MG 24 hr tablet    Sig: Take 1 tablet (300 mg total) by mouth daily. Take 1/2 tablet( 150 mg)  For first 7 days and then can increase to full dose.    Dispense:  60 tablet    Refill:  0  . hydrOXYzine (ATARAX/VISTARIL) 10 MG tablet    Sig: Take 1 tablet (10 mg total) by mouth at bedtime as needed for anxiety or vomiting (or as needed for sleep). CAUSES DROWSINESS    Dispense:  30 tablet    Refill:  1  . ALPRAZolam (XANAX) 0.25 MG tablet    Sig: Take 1-2 tablets (0.25-0.5 mg total) by  mouth every 8 (eight) hours as needed for anxiety (for flight anxiety as needed. will cause drowsiness.). Do not take with  Hydroxyzine.    Dispense:  10 tablet    Refill:  0   Return in about 1 month (around 01/06/2021), or if symptoms worsen or fail to improve, for at any time for any worsening symptoms, Go to Emergency room/ urgent care if worse.     Red Flags discussed. The patient was given clear instructions to go to ER or return to medical center if any red flags develop, symptoms do not improve, worsen or new problems develop. They verbalized understanding.   The entirety of the information documented in the History of Present Illness, Review of Systems and Physical Exam  were personally obtained by me. Portions of this information were initially documented by the CMA and reviewed by me for thoroughness and accuracy.      Marcille Buffy, [place] [phone] (phone) [phone] (fax)  [place]

## 2020-12-09 ENCOUNTER — Encounter: Payer: Self-pay | Admitting: Family Medicine

## 2020-12-30 ENCOUNTER — Other Ambulatory Visit: Payer: Self-pay

## 2021-01-03 ENCOUNTER — Encounter: Payer: Self-pay | Admitting: Adult Health

## 2021-01-03 DIAGNOSIS — Z5329 Procedure and treatment not carried out because of patient's decision for other reasons: Secondary | ICD-10-CM | POA: Insufficient documentation

## 2021-01-03 DIAGNOSIS — Z91199 Patient's noncompliance with other medical treatment and regimen due to unspecified reason: Secondary | ICD-10-CM | POA: Insufficient documentation

## 2021-01-03 NOTE — Progress Notes (Addendum)
Virtual Visit via Telephone Note  I connected with Vassie Loll on 01/04/21 at  8:00 AM EDT by telephone and verified that I am speaking with the correct person using two identifiers. Parties involved in visit as below:   Location: Patient: at home  Provider: Provider: Provider's office at  [facility], [place].      I discussed the limitations, risks, security and privacy concerns of performing an evaluation and management service by telephone and the availability of in person appointments. I also discussed with the patient that there may be a patient responsible charge related to this service. The patient expressed understanding and agreed to proceed.   History of Present Illness:     Started Wellbutrin XL 300 mg on 12/06/20 feels anxiety is decreasing. Not sleeping until about 4 am and seems starts a dream and then wakes up. He tried Atarax once.He reports it did help some. He reports he has never been a person that dreams, and he is not having vivid or frightening dreams.   He is a Garment/textile technologist. He has been trying tactical breathing, daily preparedness with law enforcement and is helping him son.   Not taking the xanax, he took for flight only and it did help.   Observations/Objective:   Patient is alert and oriented and responsive to questions Engages in conversation with provider. Speaks in full sentences without any pauses without any shortness of breath or distress.  Assessment and Plan:  The primary encounter diagnosis was Anxiety. A diagnosis of Insomnia, unspecified type was also pertinent to this visit.   Meds ordered this encounter  Medications  . buPROPion (WELLBUTRIN XL) 300 MG 24 hr tablet    Sig: Take 1 tablet (300 mg total) by mouth daily. On the first week take one tablet every other day then can increase to daily dose.    Dispense:  90 tablet    Refill:  1  . hydrOXYzine (ATARAX/VISTARIL) 10 MG tablet    Sig: Take 1 tablet (10 mg total) by  mouth at bedtime as needed for anxiety or vomiting (or as needed for sleep). CAUSES DROWSINESS    Dispense:  30 tablet    Refill:  0   Xanax only given for flight anxiety for trips at last visit not to take with Atarax.    Discussed known black box warning for anti depression/ anxiety medication. Need to report any behavioral changes right, if any homicidal or suicidal thoughts or ideas seek medical attention right away. Call 911.   Follow Up Instructions: Return in about 3 months (around 04/05/2021), or if symptoms worsen or fail to improve, for at any time for any worsening symptoms, Go to Emergency room/ urgent care if worse. Patient will call to schedule.     I discussed the assessment and treatment plan with the patient. The patient was provided an opportunity to ask questions and all were answered. The patient agreed with the plan and demonstrated an understanding of the instructions.   The patient was advised to call back or seek an in-person evaluation if the symptoms worsen or if the condition fails to improve as anticipated.  I provided 25 minutes of non-face-to-face time during this encounter.   Marcille Buffy, FNP

## 2021-01-04 ENCOUNTER — Telehealth (INDEPENDENT_AMBULATORY_CARE_PROVIDER_SITE_OTHER): Payer: Managed Care, Other (non HMO) | Admitting: Adult Health

## 2021-01-04 DIAGNOSIS — Z5329 Procedure and treatment not carried out because of patient's decision for other reasons: Secondary | ICD-10-CM

## 2021-01-04 MED ORDER — HYDROXYZINE HCL 10 MG PO TABS: 10.0000 mg | ORAL_TABLET | Freq: Every evening | ORAL | 0 refills | Status: DC | PRN

## 2021-01-04 MED ORDER — BUPROPION HCL ER (XL) 300 MG PO TB24: 300.0000 mg | ORAL_TABLET | Freq: Every day | ORAL | 1 refills | Status: DC

## 2021-01-04 NOTE — Addendum Note (Signed)
Addended by: Doreen Beam on: 01/04/2021 08:26 AM   Modules accepted: Orders, Level of Service

## 2021-01-04 NOTE — Patient Instructions (Addendum)
My new location will be : you can call and schedule a follow up in around 3 months. Your refills were sent. Please let me know if you need anything.  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 BUPROPION (byoo PROE pee on) is used to treat depression. This medicine may be used for other purposes; ask your health care provider or pharmacist if you have questions. COMMON BRAND NAME(S): Aplenzin, Budeprion XL, Forfivo XL, Wellbutrin XL What should I tell my health care provider before I take this medicine? They need to know if you have any of these conditions:  an eating disorder, such as anorexia or bulimia  bipolar disorder or psychosis  diabetes or high blood sugar, treated with medication  glaucoma  head injury or brain tumor  heart disease, previous heart attack, or irregular heart beat  high blood pressure  kidney or liver disease  seizures (convulsions)  suicidal thoughts or a previous suicide attempt  Tourette's syndrome  weight loss  an unusual or allergic reaction to bupropion, other medicines, foods, dyes, or preservatives  breast-feeding  pregnant or trying to become pregnant How should I use this medicine? Take this medicine by mouth with a glass of water. Follow the directions on the prescription label. You can take it with or without food. If it upsets your stomach, take it with food. Do not crush, chew, or cut these tablets. This medicine is taken once daily at the same time each day. Do not take your medicine more often than directed. Do not stop taking this medicine suddenly except upon the advice of your doctor. Stopping this medicine too quickly may cause serious side effects or your condition may worsen. A special MedGuide  will be given to you by the pharmacist with each prescription and refill. Be sure to read this information carefully each time. Talk to your pediatrician regarding the use of this medicine in children. Special care may be needed. Overdosage: If you think you have taken too much of this medicine contact a poison control center or emergency room at once. NOTE: This medicine is only for you. Do not share this medicine with others. What if I miss a dose? If you miss a dose, skip the missed dose and take your next tablet at the regular time. Do not take double or extra doses. What may interact with this medicine? Do not take this medicine with any of the following medications:  linezolid  MAOIs like Azilect, Carbex, Eldepryl, Marplan, Nardil, and Parnate  methylene blue (injected into a vein)  other medicines that contain bupropion like Zyban This medicine may also interact with the following medications:  alcohol  certain medicines for anxiety or sleep  certain medicines for blood pressure like metoprolol, propranolol  certain medicines for depression or psychotic disturbances  certain medicines for HIV or AIDS like efavirenz, lopinavir, nelfinavir, ritonavir  certain medicines for irregular heart beat like propafenone, flecainide  certain medicines for Parkinson's disease like amantadine, levodopa  certain medicines for seizures like carbamazepine, phenytoin, phenobarbital  cimetidine  clopidogrel  cyclophosphamide  digoxin  furazolidone  isoniazid  nicotine  orphenadrine  procarbazine  steroid medicines like prednisone or cortisone  stimulant medicines for attention disorders, weight loss, or to stay awake  tamoxifen  theophylline  thiotepa  ticlopidine  tramadol  warfarin This list may not  describe all possible interactions. Give your health care provider a list of all the medicines, herbs, non-prescription drugs, or dietary supplements you use. Also  tell them if you smoke, drink alcohol, or use illegal drugs. Some items may interact with your medicine. What should I watch for while using this medicine? Tell your doctor if your symptoms do not get better or if they get worse. Visit your doctor or healthcare provider for regular checks on your progress. Because it may take several weeks to see the full effects of this medicine, it is important to continue your treatment as prescribed by your doctor. This medicine may cause serious skin reactions. They can happen weeks to months after starting the medicine. Contact your healthcare provider right away if you notice fevers or flu-like symptoms with a rash. The rash may be red or purple and then turn into blisters or peeling of the skin. Or, you might notice a red rash with swelling of the face, lips or lymph nodes in your neck or under your arms. Patients and their families should watch out for new or worsening thoughts of suicide or depression. Also watch out for sudden changes in feelings such as feeling anxious, agitated, panicky, irritable, hostile, aggressive, impulsive, severely restless, overly excited and hyperactive, or not being able to sleep. If this happens, especially at the beginning of treatment or after a change in dose, call your healthcare provider. Avoid alcoholic drinks while taking this medicine. Drinking large amounts of alcoholic beverages, using sleeping or anxiety medicines, or quickly stopping the use of these agents while taking this medicine may increase your risk for a seizure. Do not drive or use heavy machinery until you know how this medicine affects you. This medicine can impair your ability to perform these tasks. Do not take this medicine close to bedtime. It may prevent you from sleeping. Your mouth may get dry. Chewing sugarless gum or sucking hard candy, and drinking plenty of water may help. Contact your doctor if the problem does not go away or is severe. The tablet  shell for some brands of this medicine does not dissolve. This is normal. The tablet shell may appear whole in the stool. This is not a cause for concern. What side effects may I notice from receiving this medicine? Side effects that you should report to your doctor or health care professional as soon as possible:  allergic reactions like skin rash, itching or hives, swelling of the face, lips, or tongue  breathing problems  changes in vision  confusion  elevated mood, decreased need for sleep, racing thoughts, impulsive behavior  fast or irregular heartbeat  hallucinations, loss of contact with reality  increased blood pressure  rash, fever, and swollen lymph nodes  redness, blistering, peeling or loosening of the skin, including inside the mouth  seizures  suicidal thoughts or other mood changes  unusually weak or tired  vomiting Side effects that usually do not require medical attention (report to your doctor or health care professional if they continue or are bothersome):  constipation  headache  loss of appetite  nausea  tremors  weight loss This list may not describe all possible side effects. Call your doctor for medical advice about side effects. You may report side effects to FDA at 1-800-FDA-1088. Where should I keep my medicine? Keep out of the reach of children. Store at room temperature between 15 and 30 degrees C (59 and 86 degrees F). Throw away any unused medicine after the expiration date.  NOTE: This sheet is a summary. It may not cover all possible information. If you have questions about this medicine, talk to your doctor, pharmacist, or health care provider.  2021 Elsevier/Gold Standard (2018-12-04 13:45:31) http://NIMH.NIH.Gov">  Generalized Anxiety Disorder, Adult Generalized anxiety disorder (GAD) is a mental health condition. Unlike normal worries, anxiety related to GAD is not triggered by a specific event. These worries do not fade or get  better with time. GAD interferes with relationships, work, and school. GAD symptoms can vary from mild to severe. People with severe GAD can have intense waves of anxiety with physical symptoms that are similar to panic attacks. What are the causes? The exact cause of GAD is not known, but the following are believed to have an impact:  Differences in natural brain chemicals.  Genes passed down from parents to children.  Differences in the way threats are perceived.  Development during childhood.  Personality. What increases the risk? The following factors may make you more likely to develop this condition:  Being male.  Having a family history of anxiety disorders.  Being very shy.  Experiencing very stressful life events, such as the death of a loved one.  Having a very stressful family environment. What are the signs or symptoms? People with GAD often worry excessively about many things in their lives, such as their health and family. Symptoms may also include:  Mental and emotional symptoms: ? Worrying excessively about natural disasters. ? Fear of being late. ? Difficulty concentrating. ? Fears that others are judging your performance.  Physical symptoms: ? Fatigue. ? Headaches, muscle tension, muscle twitches, trembling, or feeling shaky. ? Feeling like your heart is pounding or beating very fast. ? Feeling out of breath or like you cannot take a deep breath. ? Having trouble falling asleep or staying asleep, or experiencing restlessness. ? Sweating. ? Nausea, diarrhea, or irritable bowel syndrome (IBS).  Behavioral symptoms: ? Experiencing erratic moods or irritability. ? Avoidance of new situations. ? Avoidance of people. ? Extreme difficulty making decisions. How is this diagnosed? This condition is diagnosed based on your symptoms and medical history. You will also have a physical exam. Your health care provider may perform tests to rule out other possible  causes of your symptoms. To be diagnosed with GAD, a person must have anxiety that:  Is out of his or her control.  Affects several different aspects of his or her life, such as work and relationships.  Causes distress that makes him or her unable to take part in normal activities.  Includes at least three symptoms of GAD, such as restlessness, fatigue, trouble concentrating, irritability, muscle tension, or sleep problems. Before your health care provider can confirm a diagnosis of GAD, these symptoms must be present more days than they are not, and they must last for 6 months or longer. How is this treated? This condition may be treated with:  Medicine. Antidepressant medicine is usually prescribed for long-term daily control. Anti-anxiety medicines may be added in severe cases, especially when panic attacks occur.  Talk therapy (psychotherapy). Certain types of talk therapy can be helpful in treating GAD by providing support, education, and guidance. Options include: ? Cognitive behavioral therapy (CBT). People learn coping skills and self-calming techniques to ease their physical symptoms. They learn to identify unrealistic thoughts and behaviors and to replace them with more appropriate thoughts and behaviors. ? Acceptance and commitment therapy (ACT). This treatment teaches people how to be mindful as a way to cope with unwanted  thoughts and feelings. ? Biofeedback. This process trains you to manage your body's response (physiological response) through breathing techniques and relaxation methods. You will work with a therapist while machines are used to monitor your physical symptoms.  Stress management techniques. These include yoga, meditation, and exercise. A mental health specialist can help determine which treatment is best for you. Some people see improvement with one type of therapy. However, other people require a combination of therapies.   Follow these instructions at  home: Lifestyle  Maintain a consistent routine and schedule.  Anticipate stressful situations. Create a plan, and allow extra time to work with your plan.  Practice stress management or self-calming techniques that you have learned from your therapist or your health care provider. General instructions  Take over-the-counter and prescription medicines only as told by your health care provider.  Understand that you are likely to have setbacks. Accept this and be kind to yourself as you persist to take better care of yourself.  Recognize and accept your accomplishments, even if you judge them as small.  Keep all follow-up visits as told by your health care provider. This is important. Contact a health care provider if:  Your symptoms do not get better.  Your symptoms get worse.  You have signs of depression, such as: ? A persistently sad or irritable mood. ? Loss of enjoyment in activities that used to bring you joy. ? Change in weight or eating. ? Changes in sleeping habits. ? Avoiding friends or family members. ? Loss of energy for normal tasks. ? Feelings of guilt or worthlessness. Get help right away if:  You have serious thoughts about hurting yourself or others. If you ever feel like you may hurt yourself or others, or have thoughts about taking your own life, get help right away. Go to your nearest emergency department or:  Call your local emergency services (911 in the [place]  Call a suicide crisis helpline, such as the [facility] at 810-828-2816. This is open 24 hours a day in the [place]  Text the Crisis Text Line at 9524120811 (in the [place].). Summary  Generalized anxiety disorder (GAD) is a mental health condition that involves worry that is not triggered by a specific event.  People with GAD often worry excessively about many things in their lives, such as their health and family.  GAD may cause symptoms such as restlessness, trouble  concentrating, sleep problems, frequent sweating, nausea, diarrhea, headaches, and trembling or muscle twitching.  A mental health specialist can help determine which treatment is best for you. Some people see improvement with one type of therapy. However, other people require a combination of therapies. This information is not intended to replace advice given to you by your health care provider. Make sure you discuss any questions you have with your health care provider. Document Revised: 07/01/2019 Document Reviewed: 07/01/2019 Elsevier Patient Education  [place].

## 2021-01-05 ENCOUNTER — Encounter: Payer: Self-pay | Admitting: Urology

## 2021-01-05 ENCOUNTER — Ambulatory Visit: Payer: Managed Care, Other (non HMO) | Admitting: Urology

## 2021-01-05 ENCOUNTER — Ambulatory Visit: Payer: Managed Care, Other (non HMO) | Admitting: Dermatology

## 2021-01-06 ENCOUNTER — Ambulatory Visit: Payer: Self-pay | Admitting: Urology

## 2021-02-02 ENCOUNTER — Telehealth: Payer: Managed Care, Other (non HMO) | Admitting: Adult Health

## 2021-02-05 DIAGNOSIS — Z8616 Personal history of COVID-19: Secondary | ICD-10-CM

## 2021-02-05 HISTORY — DX: Personal history of COVID-19: Z86.16

## 2021-07-11 ENCOUNTER — Encounter: Payer: Managed Care, Other (non HMO) | Admitting: Adult Health

## 2021-08-25 ENCOUNTER — Telehealth: Payer: Self-pay | Admitting: Adult Health

## 2021-08-25 MED ORDER — BUPROPION HCL ER (XL) 300 MG PO TB24: 300.0000 mg | ORAL_TABLET | Freq: Every day | ORAL | 1 refills | Status: DC

## 2021-08-25 NOTE — Telephone Encounter (Signed)
Patient requesting a refill on buPROPion (WELLBUTRIN XL) 300 MG 24 hr tablet

## 2021-08-30 ENCOUNTER — Ambulatory Visit: Payer: Managed Care, Other (non HMO) | Admitting: Adult Health

## 2021-08-30 ENCOUNTER — Other Ambulatory Visit: Payer: Self-pay

## 2021-08-30 ENCOUNTER — Encounter: Payer: Self-pay | Admitting: Adult Health

## 2021-08-30 ENCOUNTER — Ambulatory Visit: Payer: Managed Care, Other (non HMO) | Admitting: Urology

## 2021-08-30 VITALS — BP 130/80 | HR 75 | Temp 96.3°F | Ht 71.0 in | Wt 249.5 lb

## 2021-08-30 DIAGNOSIS — Z6841 Body Mass Index (BMI) 40.0 and over, adult: Secondary | ICD-10-CM

## 2021-08-30 DIAGNOSIS — E559 Vitamin D deficiency, unspecified: Secondary | ICD-10-CM | POA: Diagnosis not present

## 2021-08-30 DIAGNOSIS — R030 Elevated blood-pressure reading, without diagnosis of hypertension: Secondary | ICD-10-CM | POA: Diagnosis not present

## 2021-08-30 DIAGNOSIS — G4700 Insomnia, unspecified: Secondary | ICD-10-CM | POA: Diagnosis not present

## 2021-08-30 DIAGNOSIS — E785 Hyperlipidemia, unspecified: Secondary | ICD-10-CM

## 2021-08-30 DIAGNOSIS — F419 Anxiety disorder, unspecified: Secondary | ICD-10-CM | POA: Diagnosis not present

## 2021-08-30 DIAGNOSIS — J849 Interstitial pulmonary disease, unspecified: Secondary | ICD-10-CM

## 2021-08-30 DIAGNOSIS — Z6834 Body mass index (BMI) 34.0-34.9, adult: Secondary | ICD-10-CM

## 2021-08-30 LAB — LIPID PANEL
Cholesterol: 247 mg/dL — ABNORMAL HIGH (ref 0–200)
HDL: 47.3 mg/dL (ref >39.00)
LDL Cholesterol: 182 mg/dL — ABNORMAL HIGH (ref 0–99)
NonHDL: 199.97
Total CHOL/HDL Ratio: 5
Triglycerides: 91 mg/dL (ref 0.0–149.0)
VLDL: 18.2 mg/dL (ref 0.0–40.0)

## 2021-08-30 LAB — COMPREHENSIVE METABOLIC PANEL
ALT: 21 U/L (ref 0–53)
AST: 15 U/L (ref 0–37)
Albumin: 4.6 g/dL (ref 3.5–5.2)
Alkaline Phosphatase: 71 U/L (ref 39–117)
BUN: 10 mg/dL (ref 6–23)
CO2: 29 mEq/L (ref 19–32)
Calcium: 10 mg/dL (ref 8.4–10.5)
Chloride: 102 mEq/L (ref 96–112)
Creatinine, Ser: 1.2 mg/dL (ref 0.40–1.50)
GFR: 79.27 mL/min (ref >60.00)
Glucose, Bld: 87 mg/dL (ref 70–99)
Potassium: 4.1 mEq/L (ref 3.5–5.1)
Sodium: 137 mEq/L (ref 135–145)
Total Bilirubin: 0.7 mg/dL (ref 0.2–1.2)
Total Protein: 7.4 g/dL (ref 6.0–8.3)

## 2021-08-30 LAB — CBC WITH DIFFERENTIAL/PLATELET
Basophils Absolute: 0 10*3/uL (ref 0.0–0.1)
Basophils Relative: 0.5 % (ref 0.0–3.0)
Eosinophils Absolute: 0.1 10*3/uL (ref 0.0–0.7)
Eosinophils Relative: 5.6 % — ABNORMAL HIGH (ref 0.0–5.0)
HCT: 41.2 % (ref 39.0–52.0)
Hemoglobin: 13.5 g/dL (ref 13.0–17.0)
Lymphocytes Relative: 46 % (ref 12.0–46.0)
Lymphs Abs: 1.2 10*3/uL (ref 0.7–4.0)
MCHC: 32.9 g/dL (ref 30.0–36.0)
MCV: 78.7 fl (ref 78.0–100.0)
Monocytes Absolute: 0.2 10*3/uL (ref 0.1–1.0)
Monocytes Relative: 9.3 % (ref 3.0–12.0)
Neutro Abs: 1 10*3/uL — ABNORMAL LOW (ref 1.4–7.7)
Neutrophils Relative %: 38.6 % — ABNORMAL LOW (ref 43.0–77.0)
Platelets: 153 10*3/uL (ref 150.0–400.0)
RBC: 5.24 Mil/uL (ref 4.22–5.81)
RDW: 13.8 % (ref 11.5–15.5)
WBC: 2.5 10*3/uL — ABNORMAL LOW (ref 4.0–10.5)

## 2021-08-30 LAB — TSH: TSH: 1.33 u[IU]/mL (ref 0.35–5.50)

## 2021-08-30 MED ORDER — TRAZODONE HCL 50 MG PO TABS
25.0000 mg | ORAL_TABLET | Freq: Every evening | ORAL | 1 refills | Status: DC | PRN
Start: 2021-08-30 — End: 2022-07-18

## 2021-08-30 NOTE — Patient Instructions (Signed)
Managing Anxiety, Adult After being diagnosed with anxiety, you may be relieved to know why you have felt or behaved a certain way. You may also feel overwhelmed about the treatment ahead and what it will mean for your life. With care and support, you can manage this condition. How to manage lifestyle changes Managing stress and anxiety Stress is your body's reaction to life changes and events, both good and bad. Most stress will last just a few hours, but stress can be ongoing and can lead to more than just stress. Although stress can play a major role in anxiety, it is not the same as anxiety. Stress is usually caused by something external, such as a deadline, test, or competition. Stress normally passes after the triggering event has ended.  Anxiety is caused by something internal, such as imagining a terrible outcome or worrying that something will go wrong that will devastate you. Anxiety often does not go away even after the triggering event is over, and it can become long-term (chronic) worry. It is important to understand the differences between stress and anxiety and to manage your stress effectively so that it does not lead to an anxious response. Talk with your health care provider or a counselor to learn more about reducing anxiety and stress. He or she may suggest tension reduction techniques, such as: Music therapy. Spend time creating or listening to music that you enjoy and that inspires you. Mindfulness-based meditation. Practice being aware of your normal breaths while not trying to control your breathing. It can be done while sitting or walking. Centering prayer. This involves focusing on a word, phrase, or sacred image that means something to you and brings you peace. Deep breathing. To do this, expand your stomach and inhale slowly through your nose. Hold your breath for 3-5 seconds. Then exhale slowly, letting your stomach muscles relax. Self-talk. Learn to notice and identify  thought patterns that lead to anxiety reactions and change those patterns to thoughts that feel peaceful. Muscle relaxation. Taking time to tense muscles and then relax them. Choose a tension reduction technique that fits your lifestyle and personality. These techniques take time and practice. Set aside 5-15 minutes a day to do them. Therapists can offer counseling and training in these techniques. The training to help with anxiety may be covered by some insurance plans. Other things you can do to manage stress and anxiety include: Keeping a stress diary. This can help you learn what triggers your reaction and then learn ways to manage your response. Thinking about how you react to certain situations. You may not be able to control everything, but you can control your response. Making time for activities that help you relax and not feeling guilty about spending your time in this way. Doing visual imagery. This involves imagining or creating mental pictures to help you relax. Practicing yoga. Through yoga poses, you can lower tension and promote relaxation.  Medicines Medicines can help ease symptoms. Medicines for anxiety include: Antidepressant medicines. These are usually prescribed for long-term daily control. Anti-anxiety medicines. These may be added in severe cases, especially when panic attacks occur. Medicines will be prescribed by a health care provider. When used together, medicines, psychotherapy, and tension reduction techniques may be the most effective treatment. Relationships Relationships can play a big part in helping you recover. Try to spend more time connecting with trusted friends and family members. Consider going to couples counseling if you have a partner, taking family education classes, or going  to family therapy. Therapy can help you and others better understand your condition. How to recognize changes in your anxiety Everyone responds differently to treatment for  anxiety. Recovery from anxiety happens when symptoms decrease and stop interfering with your daily activities at home or work. This may mean that you will start to: Have better concentration and focus. Worry will interfere less in your daily thinking. Sleep better. Be less irritable. Have more energy. Have improved memory. It is also important to recognize when your condition is getting worse. Contact your health care provider if your symptoms interfere with home or work and you feel like your condition is not improving. Follow these instructions at home: Activity Exercise. Adults should do the following: Exercise for at least 150 minutes each week. The exercise should increase your heart rate and make you sweat (moderate-intensity exercise). Strengthening exercises at least twice a week. Get the right amount and quality of sleep. Most adults need 7-9 hours of sleep each night. Lifestyle  Eat a healthy diet that includes plenty of vegetables, fruits, whole grains, low-fat dairy products, and lean protein. Do not eat a lot of foods that are high in fats, added sugars, or salt (sodium). Make choices that simplify your life. Do not use any products that contain nicotine or tobacco. These products include cigarettes, chewing tobacco, and vaping devices, such as e-cigarettes. If you need help quitting, ask your health care provider. Avoid caffeine, alcohol, and certain over-the-counter cold medicines. These may make you feel worse. Ask your pharmacist which medicines to avoid. General instructions Take over-the-counter and prescription medicines only as told by your health care provider. Keep all follow-up visits. This is important. Where to find support You can get help and support from these sources: Self-help groups. Online and [employer]. A trusted spiritual leader. Couples counseling. Family education classes. Family therapy. Where to find more information You may find  that joining a support group helps you deal with your anxiety. The following sources can help you locate counselors or support groups near you: [place]: www.mentalhealthamerica.net Anxiety and Depression Association of [place] (ADAA): https://www.clark.net/ National Alliance on Mental Illness (NAMI): www.nami.org Contact a health care provider if: You have a hard time staying focused or finishing daily tasks. You spend many hours a day feeling worried about everyday life. You become exhausted by worry. You start to have headaches or frequently feel tense. You develop chronic nausea or diarrhea. Get help right away if: You have a racing heart and shortness of breath. You have thoughts of hurting yourself or others. If you ever feel like you may hurt yourself or others, or have thoughts about taking your own life, get help right away. Go to your nearest emergency department or: Call your local emergency services (911 in the [place] Call a suicide crisis helpline, such as the [facility] at 856-544-6004 or 988 in the [place]. This is open 24 hours a day in the [place] Text the Crisis Text Line at 623-381-9096 (in the [place].). Summary Taking steps to learn and use tension reduction techniques can help calm you and help prevent triggering an anxiety reaction. When used together, medicines, psychotherapy, and tension reduction techniques may be the most effective treatment. Family, friends, and partners can play a big part in supporting you. This information is not intended to replace advice given to you by your health care provider. Make sure you discuss any questions you have with your health care provider. Document Revised: 04/05/2021 Document Reviewed: 01/01/2021  Elsevier Patient Education  2022 [place]. Trazodone Tablets What is this medication? TRAZODONE (TRAZ oh done) treats depression. It increases the amount of serotonin in the brain, a hormone that helps regulate  mood. This medicine may be used for other purposes; ask your health care provider or pharmacist if you have questions. COMMON BRAND NAME(S): Desyrel What should I tell my care team before I take this medication? They need to know if you have any of these conditions: Attempted suicide or thinking about it Bipolar disorder Bleeding problems Glaucoma Heart disease, or previous heart attack Irregular heart beat Kidney or liver disease Low levels of sodium in the blood An unusual or allergic reaction to trazodone, other medications, foods, dyes or preservatives Pregnant or trying to get pregnant Breast-feeding How should I use this medication? Take this medication by mouth with a glass of water. Follow the directions on the prescription label. Take this medication shortly after a meal or a light snack. Take your medication at regular intervals. Do not take your medication more often than directed. Do not stop taking this medication suddenly except upon the advice of your care team. Stopping this medication too quickly may cause serious side effects or your condition may worsen. A special MedGuide will be given to you by the pharmacist with each prescription and refill. Be sure to read this information carefully each time. Talk to your care team regarding the use of this medication in children. Special care may be needed. Overdosage: If you think you have taken too much of this medicine contact a poison control center or emergency room at once. NOTE: This medicine is only for you. Do not share this medicine with others. What if I miss a dose? If you miss a dose, take it as soon as you can. If it is almost time for your next dose, take only that dose. Do not take double or extra doses. What may interact with this medication? Do not take this medication with any of the following: Certain medications for fungal infections like fluconazole, itraconazole, ketoconazole, posaconazole,  voriconazole Cisapride Dronedarone Linezolid MAOIs like Carbex, Eldepryl, Marplan, Nardil, and Parnate Mesoridazine Methylene blue (injected into a vein) Pimozide Saquinavir Thioridazine This medication may also interact with the following: Alcohol Antiviral medications for HIV or AIDS Aspirin and aspirin-like medications Barbiturates like phenobarbital Certain medications for blood pressure, heart disease, irregular heart beat Certain medications for depression, anxiety, or psychotic disturbances Certain medications for migraine headache like almotriptan, eletriptan, frovatriptan, naratriptan, rizatriptan, sumatriptan, zolmitriptan Certain medications for seizures like carbamazepine and phenytoin Certain medications for sleep Certain medications that treat or prevent blood clots like dalteparin, enoxaparin, warfarin Digoxin Fentanyl Lithium NSAIDS, medications for pain and inflammation, like ibuprofen or naproxen Other medications that prolong the QT interval (cause an abnormal heart rhythm) like dofetilide Rasagiline Supplements like St. John's wort, kava kava, valerian Tramadol Tryptophan This list may not describe all possible interactions. Give your health care provider a list of all the medicines, herbs, non-prescription drugs, or dietary supplements you use. Also tell them if you smoke, drink alcohol, or use illegal drugs. Some items may interact with your medicine. What should I watch for while using this medication? Tell your care team if your symptoms do not get better or if they get worse. Visit your care team for regular checks on your progress. Because it may take several weeks to see the full effects of this medication, it is important to continue your treatment as prescribed by your care team.  Watch for new or worsening thoughts of suicide or depression. This includes sudden changes in mood, behaviors, or thoughts. These changes can happen at any time but are more  common in the beginning of treatment or after a change in dose. Call your care team right away if you experience these thoughts or worsening depression. Manic episodes may happen in patients with bipolar disorder who take this medication. Watch for changes in feelings or behaviors such as feeling anxious, nervous, agitated, panicky, irritable, hostile, aggressive, impulsive, severely restless, overly excited and hyperactive, or trouble sleeping. These changes can happen at any time but are more common in the beginning of treatment or after a change in dose. Call your care team right away if you notice any of these symptoms. You may get drowsy or dizzy. Do not drive, use machinery, or do anything that needs mental alertness until you know how this medication affects you. Do not stand or sit up quickly, especially if you are an older patient. This reduces the risk of dizzy or fainting spells. Alcohol may interfere with the effect of this medication. Avoid alcoholic drinks. This medication may cause dry eyes and blurred vision. If you wear contact lenses you may feel some discomfort. Lubricating drops may help. See your eye doctor if the problem does not go away or is severe. Your mouth may get dry. Chewing sugarless gum, sucking hard candy and drinking plenty of water may help. Contact your care team if the problem does not go away or is severe. What side effects may I notice from receiving this medication? Side effects that you should report to your care team as soon as possible: Allergic reactions--skin rash, itching, hives, swelling of the face, lips, tongue, or throat Bleeding--bloody or black, tar-like stools, red or dark brown urine, vomiting blood or brown material that looks like coffee grounds, small, red or purple spots on skin, unusual bleeding or bruising Heart rhythm changes--fast or irregular heartbeat, dizziness, feeling faint or lightheaded, chest pain, trouble breathing Low blood  pressure--dizziness, feeling faint or lightheaded, blurry vision Low sodium level--muscle weakness, fatigue, dizziness, headache, confusion Prolonged or painful erection Serotonin syndrome--irritability, confusion, fast or irregular heartbeat, muscle stiffness, twitching muscles, sweating, high fever, seizures, chills, vomiting, diarrhea Sudden eye pain or change in vision such as blurry vision, seeing halos around lights, vision loss Thoughts of suicide or self-harm, worsening mood, feelings of depression Side effects that usually do not require medical attention (report to your care team if they continue or are bothersome): Change in sex drive or performance Constipation Dizziness Drowsiness Dry mouth This list may not describe all possible side effects. Call your doctor for medical advice about side effects. You may report side effects to FDA at 1-800-FDA-1088. Where should I keep my medication? Keep out of the reach of children and pets. Store at room temperature between 15 and 30 degrees C (59 to 86 degrees F). Protect from light. Keep container tightly closed. Throw away any unused medication after the expiration date. NOTE: This sheet is a summary. It may not cover all possible information. If you have questions about this medicine, talk to your doctor, pharmacist, or health care provider.  2022 Elsevier/Gold Standard (2020-08-31 00:00:00)

## 2021-08-30 NOTE — Progress Notes (Signed)
Acute Office Visit  Subjective:    Patient ID: Paul Silva, male    DOB: [birth year], 35 y.o.   MRN: [id number]  Chief Complaint  Patient presents with   Medication Refill    Feels that he needs an increase in his wellbutrin    HPI Patient is in today for anxiety medication refill. He was off Wellbutrin for a few weeks, he just went without it a few weeks.  He feels well but he sees a lot of trauma and has high stress job, he is an Garment/textile technologist at [employer]. Labs ordered in March have not been done yet.  He is taking Wellbutrin XR 300 mg, he was off of it for a few weeks Anxiety seem to increase.  He rarely takes hydroxyzine maybe once a month.  He still is not sleeping well. Declines influenza vaccine.  He has had COVID vaccines for his work he is a Engineer, structural.  He will bring his card so that we can document low-dose.  Do not have record of that in the system.  Patient  denies any fever, body aches,chills, rash, chest pain, shortness of breath, nausea, vomiting, or diarrhea.  Denies dizziness, lightheadedness, pre syncopal or syncopal episodes.  Otherwise he feels well and has no concerns.  Past Medical History:  Diagnosis Date   Allergy    Anxiety    Asthma    Fracture of thumb    Frequent headaches    Migraines     History reviewed. No pertinent surgical history.  Family History  Problem Relation Age of Onset   Hypertension Father    Diabetes Father    Diabetes Mother    Prostate cancer Neg Hx    Colon cancer Neg Hx     Social History   Socioeconomic History   Marital status: Married    Spouse name: Not on file   Number of children: Not on file   Years of education: Not on file   Highest education level: Not on file  Occupational History   Not on file  Tobacco Use   Smoking status: Never   Smokeless tobacco: Never  Vaping Use   Vaping Use: Never used  Substance and Sexual Activity   Alcohol use: Not Currently    Alcohol/week: 0.0 standard drinks   Drug  use: No   Sexual activity: Yes  Other Topics Concern   Not on file  Social History Narrative   Librarian, academic- homeland security   Social Determinants of Health   Financial Resource Strain: Not on file  Food Insecurity: Not on file  Transportation Needs: Not on file  Physical Activity: Not on file  Stress: Not on file  Social Connections: Not on file  Intimate Partner Violence: Not on file    Outpatient Medications Prior to Visit  Medication Sig Dispense Refill   buPROPion (WELLBUTRIN XL) 300 MG 24 hr tablet Take 1 tablet (300 mg total) by mouth daily. On the first week take one tablet every other day then can increase to daily dose. 90 tablet 1   hydrOXYzine (ATARAX/VISTARIL) 10 MG tablet Take 1 tablet (10 mg total) by mouth at bedtime as needed for anxiety or vomiting (or as needed for sleep). CAUSES DROWSINESS 30 tablet 0   albuterol (PROAIR HFA) 108 (90 Base) MCG/ACT inhaler Inhale 1 puff into the lungs every 6 (six) hours as needed for wheezing or shortness of breath. (Patient not taking: Reported on 09/29/2020) 18 g 1  ALPRAZolam (XANAX) 0.25 MG tablet Take 1-2 tablets (0.25-0.5 mg total) by mouth every 8 (eight) hours as needed for anxiety (for flight anxiety as needed. will cause drowsiness.). Do not take with  Hydroxyzine. 10 tablet 0   clomiPHENE (CLOMID) 50 MG tablet Take 1 tablet (50 mg total) by mouth See admin instructions. Take one tablet by mouth every Monday and Thursday 24 tablet 1   Ergocalciferol 2000 units TABS Take 1 tablet by mouth daily. 30 tablet 3   mometasone (ELOCON) 0.1 % cream Apply to the affected areas of the arms QHS on Monday, Wednesday, and Friday. 50 g 2   tacrolimus (PROTOPIC) 0.1 % ointment Apply to affected areas on the arms QHS on Tuesday, Thursday, and Saturday. 60 g 2   No facility-administered medications prior to visit.    No Known Allergies  Review of Systems  Constitutional: Negative.   HENT: Negative.     Respiratory: Negative.    Cardiovascular: Negative.   Gastrointestinal: Negative.   Genitourinary: Negative.   Musculoskeletal: Negative.   Neurological: Negative.   Psychiatric/Behavioral:  Positive for sleep disturbance. Negative for decreased concentration and self-injury. The patient is nervous/anxious. The patient is not hyperactive.       Objective:    Physical Exam Vitals and nursing note reviewed.  Constitutional:      General: He is not in acute distress.    Appearance: Normal appearance. He is well-developed. He is not ill-appearing, toxic-appearing or diaphoretic.     Comments: Patient is alert and oriented and responsive to questions Engages in eye contact with provider. Speaks in full sentences without any pauses without any shortness of breath or distress.    HENT:     Head: Normocephalic and atraumatic.     Right Ear: Hearing, tympanic membrane, ear canal and external ear normal.     Left Ear: Hearing, tympanic membrane, ear canal and external ear normal.     Nose: Nose normal.     Mouth/Throat:     Pharynx: Uvula midline. No oropharyngeal exudate.  Eyes:     General: Lids are normal. No scleral icterus.       Right eye: No discharge.        Left eye: No discharge.     Conjunctiva/sclera: Conjunctivae normal.     Pupils: Pupils are equal, round, and reactive to light.  Neck:     Thyroid: No thyromegaly.     Vascular: Normal carotid pulses. No carotid bruit, hepatojugular reflux or JVD.     Trachea: Trachea and phonation normal. No tracheal tenderness or tracheal deviation.     Meningeal: Brudzinski's sign absent.  Cardiovascular:     Rate and Rhythm: Normal rate and regular rhythm.     Pulses: Normal pulses.     Heart sounds: Normal heart sounds, S1 normal and S2 normal. Heart sounds not distant. No murmur heard.   No friction rub. No gallop.  Pulmonary:     Effort: Pulmonary effort is normal. No accessory muscle usage or respiratory distress.     Breath  sounds: Normal breath sounds. No stridor. No wheezing, rhonchi or rales.  Chest:     Chest wall: No tenderness.  Abdominal:     General: Bowel sounds are normal. There is no distension.     Palpations: Abdomen is soft. There is no mass.     Tenderness: There is no abdominal tenderness. There is no guarding or rebound.  Genitourinary:    Comments: No gynecology exams done in this  office currently and no equipment available. Patient is aware she will have to see gynecology if needed for any pelvic/vaginal exam and will follow up with gynecology/obgyn as needed. Yearly gynecology pelvic exam recommended. Patient verbalized understanding of instructions and denies any further questions at this time.   Musculoskeletal:        General: No tenderness or deformity. Normal range of motion.     Cervical back: Full passive range of motion without pain, normal range of motion and neck supple.     Comments: Patient moves on and off of exam table and in room without difficulty. Gait is normal in hall and in room. Patient is oriented to person place time and situation. Patient answers questions appropriately and engages in conversation.   Lymphadenopathy:     Head:     Right side of head: No submental, submandibular, tonsillar, preauricular, posterior auricular or occipital adenopathy.     Left side of head: No submental, submandibular, tonsillar, preauricular, posterior auricular or occipital adenopathy.     Cervical: No cervical adenopathy.  Skin:    General: Skin is warm and dry.     Capillary Refill: Capillary refill takes less than 2 seconds.     Coloration: Skin is not pale.     Findings: No erythema or rash.     Nails: There is no clubbing.  Neurological:     Mental Status: He is alert and oriented to person, place, and time.     GCS: GCS eye subscore is 4. GCS verbal subscore is 5. GCS motor subscore is 6.     Cranial Nerves: No cranial nerve deficit.     Sensory: No sensory deficit.      Motor: No abnormal muscle tone.     Coordination: Coordination normal.     Deep Tendon Reflexes: Reflexes are normal and symmetric. Reflexes normal.  Psychiatric:        Speech: Speech normal.        Behavior: Behavior normal.        Thought Content: Thought content normal.        Judgment: Judgment normal.    BP 130/80   Pulse 75   Temp (!) 96.3 F (35.7 C) (Temporal)   Ht '5\' 11"'  (1.803 m)   Wt 249 lb 8 oz (113.2 kg)   SpO2 99%   BMI 34.80 kg/m  Wt Readings from Last 3 Encounters:  08/30/21 249 lb 8 oz (113.2 kg)  12/06/20 290 lb 6.4 oz (131.7 kg)  07/08/20 262 lb (118.8 kg)    There are no preventive care reminders to display for this patient.   There are no preventive care reminders to display for this patient.   Lab Results  Component Value Date   TSH 2.10 05/20/2018   Lab Results  Component Value Date   WBC 2.9 (L) 05/20/2018   HGB 14.7 11/13/2019   HCT 44.8 06/21/2020   MCV 79.0 05/20/2018   PLT 159.0 05/20/2018   Lab Results  Component Value Date   NA 135 05/20/2018   K 3.9 05/20/2018   CO2 28 05/20/2018   GLUCOSE 90 05/20/2018   BUN 12 05/20/2018   CREATININE 1.15 05/20/2018   BILITOT 0.7 05/20/2018   ALKPHOS 63 05/20/2018   AST 19 05/20/2018   ALT 19 05/20/2018   PROT 7.8 05/20/2018   ALBUMIN 4.6 05/20/2018   CALCIUM 10.1 05/20/2018   GFR 95.67 05/20/2018   Lab Results  Component Value Date   CHOL 288 (H)  01/20/2018   Lab Results  Component Value Date   HDL 47.70 01/20/2018   Lab Results  Component Value Date   LDLCALC 214 (H) 01/20/2018   Lab Results  Component Value Date   TRIG 132.0 01/20/2018   Lab Results  Component Value Date   CHOLHDL 6 01/20/2018   Lab Results  Component Value Date   HGBA1C 5.9 01/20/2018       Assessment & Plan:   Problem List Items Addressed This Visit       Respiratory   Interstitial pulmonary disease ([place])     Other   Blood pressure elevated without history of HTN   Relevant Orders    Comp Met (CMET)   Insomnia - Primary   Relevant Medications   traZODone (DESYREL) 50 MG tablet   Vitamin D deficiency   Relevant Orders   Vitamin D 1,25 dihydroxy   Hyperlipidemia   Relevant Orders   Lipid panel   Anxiety   Relevant Medications   traZODone (DESYREL) 50 MG tablet   Other Visit Diagnoses     Body mass index (BMI) of 40.1-44.9 in adult [facility])       Relevant Orders   CBC w/Diff   TSH   Body mass index (BMI) of 34.0-34.9 in adult          Discussed adding trazodone to help with his insomnia, he will continue Wellbutrin 300 XL.  Once daily.  Patient reports he verbalized understanding of side effects and knows that trazodone will cause drowsiness and to avoid driving with this medication taking only at bedtime as needed for sleep.   Meds ordered this encounter  Medications   traZODone (DESYREL) 50 MG tablet    Sig: Take 0.5-1 tablets (25-50 mg total) by mouth at bedtime as needed for sleep.    Dispense:  30 tablet    Refill:  1   He is due for labs that were ordered back in March he never went for those he is again encouraged to go for his labs today. Red Flags discussed. The patient was given clear instructions to go to ER or return to medical center if any red flags develop, symptoms do not improve, worsen or new problems develop. They verbalized understanding.  Return in about 3 months (around 11/28/2021), or if symptoms worsen or fail to improve, for at any time for any worsening symptoms, Go to Emergency room/ urgent care if worse.   Marcille Buffy, FNP

## 2021-08-30 NOTE — Progress Notes (Signed)
TSH for thyroid, cmp  within normal limits.  Total cholesterol and LDL elevated.  Discuss lifestyle modification with patient e.g. increase exercise, fiber, fruits, vegetables, lean meat, and omega 3/fish intake and decrease saturated fat.  If patient following strict diet and exercise program already please schedule follow up appointment with primary care physician   Cbc white blood cells low at 2.5. other scant irregularities. Lets recheck CBC with diff in one month. Please add lab and schedule.

## 2021-08-31 ENCOUNTER — Other Ambulatory Visit: Payer: Self-pay

## 2021-08-31 DIAGNOSIS — R899 Unspecified abnormal finding in specimens from other organs, systems and tissues: Secondary | ICD-10-CM

## 2021-08-31 DIAGNOSIS — D729 Disorder of white blood cells, unspecified: Secondary | ICD-10-CM

## 2021-09-04 ENCOUNTER — Encounter: Payer: Self-pay | Admitting: *Deleted

## 2021-09-04 LAB — VITAMIN D 1,25 DIHYDROXY
Vitamin D 1, 25 (OH)2 Total: 43 pg/mL (ref 18–72)
Vitamin D2 1, 25 (OH)2: <8 pg/mL
Vitamin D3 1, 25 (OH)2: 43 pg/mL

## 2021-09-04 NOTE — Progress Notes (Signed)
Vitamin D is within normal limits. Low end normal would take vitamin D 3 at 4,000 international units once daily and recheck Vitamin D lab in 3- 4 months.

## 2021-09-06 ENCOUNTER — Encounter: Payer: Self-pay | Admitting: Oncology

## 2021-09-06 ENCOUNTER — Inpatient Hospital Stay: Payer: Managed Care, Other (non HMO)

## 2021-09-06 ENCOUNTER — Other Ambulatory Visit: Payer: Self-pay

## 2021-09-06 ENCOUNTER — Inpatient Hospital Stay: Payer: Managed Care, Other (non HMO) | Attending: Oncology | Admitting: Oncology

## 2021-09-06 VITALS — BP 136/90 | HR 82 | Temp 97.4°F | Resp 18 | Ht 71.0 in | Wt 251.3 lb

## 2021-09-06 DIAGNOSIS — Z79899 Other long term (current) drug therapy: Secondary | ICD-10-CM | POA: Insufficient documentation

## 2021-09-06 DIAGNOSIS — D72819 Decreased white blood cell count, unspecified: Secondary | ICD-10-CM

## 2021-09-06 LAB — CBC WITH DIFFERENTIAL/PLATELET
Abs Immature Granulocytes: 0 10*3/uL (ref 0.00–0.07)
Basophils Absolute: 0 10*3/uL (ref 0.0–0.1)
Basophils Relative: 1 %
Eosinophils Absolute: 0.1 10*3/uL (ref 0.0–0.5)
Eosinophils Relative: 5 %
HCT: 43.4 % (ref 39.0–52.0)
Hemoglobin: 14.1 g/dL (ref 13.0–17.0)
Immature Granulocytes: 0 %
Lymphocytes Relative: 48 %
Lymphs Abs: 1.3 10*3/uL (ref 0.7–4.0)
MCH: 26 pg (ref 26.0–34.0)
MCHC: 32.5 g/dL (ref 30.0–36.0)
MCV: 80.1 fL (ref 80.0–100.0)
Monocytes Absolute: 0.2 10*3/uL (ref 0.1–1.0)
Monocytes Relative: 8 %
Neutro Abs: 1 10*3/uL — ABNORMAL LOW (ref 1.7–7.7)
Neutrophils Relative %: 38 %
Platelets: 168 10*3/uL (ref 150–400)
RBC: 5.42 MIL/uL (ref 4.22–5.81)
RDW: 13.2 % (ref 11.5–15.5)
WBC: 2.7 10*3/uL — ABNORMAL LOW (ref 4.0–10.5)
nRBC: 0 % (ref 0.0–0.2)

## 2021-09-06 LAB — HEPATITIS PANEL, ACUTE
HCV Ab: NONREACTIVE
Hep A IgM: NONREACTIVE
Hep B C IgM: NONREACTIVE
Hepatitis B Surface Ag: NONREACTIVE

## 2021-09-06 LAB — TECHNOLOGIST SMEAR REVIEW
Plt Morphology: NORMAL
RBC MORPHOLOGY: NORMAL
WBC MORPHOLOGY: NORMAL

## 2021-09-06 LAB — HIV ANTIBODY (ROUTINE TESTING W REFLEX): HIV Screen 4th Generation wRfx: NONREACTIVE

## 2021-09-06 LAB — VITAMIN B12: Vitamin B-12: 243 pg/mL (ref 180–914)

## 2021-09-06 LAB — FOLATE: Folate: 9.2 ng/mL (ref >5.9)

## 2021-09-06 NOTE — Progress Notes (Signed)
Hematology/Oncology Consult note [phone] [phone]      Patient Care Team: Doreen Beam, FNP as PCP - General (Family Medicine) Christene Lye, MD (General Surgery) Talmage Nap, PA-C as Physician Assistant (Physician Assistant)  REFERRING PROVIDER: Sharmon Leyden*  CHIEF COMPLAINTS/REASON FOR VISIT:  Evaluation of abnormal white blood cell count  HISTORY OF PRESENTING ILLNESS:   Paul Silva is a  35 y.o.  male with PMH listed below was seen in consultation at the request of  Flinchum, Michelle S, F*  for evaluation of abnormal white blood cell count.  Patient reports feeling well. Denies weight loss, fever, chills, fatigue, night sweats.  No frequent infections.   Review of Systems  Constitutional:  Negative for appetite change, chills, fatigue, fever and unexpected weight change.  HENT:   Negative for hearing loss and voice change.   Eyes:  Negative for eye problems and icterus.  Respiratory:  Negative for chest tightness, cough and shortness of breath.   Cardiovascular:  Negative for chest pain and leg swelling.  Gastrointestinal:  Negative for abdominal distention and abdominal pain.  Endocrine: Negative for hot flashes.  Genitourinary:  Negative for difficulty urinating, dysuria and frequency.   Musculoskeletal:  Negative for arthralgias.  Skin:  Negative for itching and rash.  Neurological:  Negative for light-headedness and numbness.  Hematological:  Negative for adenopathy. Does not bruise/bleed easily.  Psychiatric/Behavioral:  Negative for confusion.    MEDICAL HISTORY:  Past Medical History:  Diagnosis Date   Abnormal WBC count    Allergy    Anxiety    Asthma    Blood pressure elevated without history of HTN    Fracture of thumb    Frequent headaches    Hyperlipidemia    Hyperpigmentation    Insomnia    Interstitial pulmonary disease ([facility])    Migraines    Personal history of COVID-19  02/05/2021   Vitamin D deficiency     SURGICAL HISTORY: History reviewed. No pertinent surgical history.  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  MEDICATIONS:  Current Outpatient Medications  Medication Sig Dispense Refill   buPROPion (WELLBUTRIN XL) 300 MG 24 hr tablet Take 1 tablet (300  mg total) by mouth daily. On the first week take one tablet every other day then can increase to daily dose. 90 tablet 1   hydrOXYzine (ATARAX/VISTARIL) 10 MG tablet Take 1 tablet (10 mg total) by mouth at bedtime as needed for anxiety or vomiting (or as needed for sleep). CAUSES DROWSINESS 30 tablet 0   traZODone (DESYREL) 50 MG tablet Take 0.5-1 tablets (25-50 mg total) by mouth at bedtime as needed for sleep. 30 tablet 1   No current facility-administered medications for this visit.     PHYSICAL EXAMINATION: ECOG PERFORMANCE STATUS: 1 -  Symptomatic but completely ambulatory Vitals:   09/06/21 0955  BP: 136/90  Pulse: 82  Resp: 18  Temp: (!) 97.4 F (36.3 C)   Filed Weights   09/06/21 0955  Weight: 251 lb 4.8 oz (114 kg)    Physical Exam Constitutional:      General: He is not in acute distress. HENT:     Head: Normocephalic and atraumatic.  Eyes:     General: No scleral icterus. Cardiovascular:     Rate and Rhythm: Normal rate and regular rhythm.     Heart sounds: Normal heart sounds.  Pulmonary:     Effort: Pulmonary effort is normal. No respiratory distress.     Breath sounds: No wheezing.  Abdominal:     General: Bowel sounds are normal. There is no distension.     Palpations: Abdomen is soft.  Musculoskeletal:        General: No deformity. Normal range of motion.     Cervical back: Normal range of motion and neck supple.  Skin:    General: Skin is warm and dry.     Findings: No erythema or rash.  Neurological:     Mental Status: He is alert and oriented to person, place, and time. Mental status is at baseline.     Cranial Nerves: No cranial nerve deficit.     Coordination: Coordination normal.  Psychiatric:        Mood and Affect: Mood normal.    LABORATORY DATA:  I have reviewed the data as listed Lab Results  Component Value Date   WBC 2.7 (L) 09/06/2021   HGB 14.1 09/06/2021   HCT 43.4 09/06/2021   MCV 80.1 09/06/2021   PLT 168 09/06/2021   Recent Labs    08/30/21 0923  NA 137  K 4.1  CL 102  CO2 29  GLUCOSE 87  BUN 10  CREATININE 1.20  CALCIUM 10.0  PROT 7.4  ALBUMIN 4.6  AST 15  ALT 21  ALKPHOS 71  BILITOT 0.7   Iron/TIBC/Ferritin/ %Sat No results found for: IRON, TIBC, FERRITIN, IRONPCTSAT    RADIOGRAPHIC STUDIES: I have personally reviewed the radiological images as listed and agreed with the findings in the report. No results found.    ASSESSMENT & PLAN:  1. Leukopenia, unspecified type    Labs are reviewed and discussed with patient. Leukopenia,  predominantly neutropenia. No constitutional symptoms. No lymphadenopathy on physical examination.  Check cbc, smear, HIV, hepatitis panel, multiple myeloma panel, light chain ratio, folate, B12.   Orders Placed This Encounter  Procedures   Folate    Standing Status:   Future    Number of Occurrences:   1    Standing Expiration Date:   09/06/2022   Vitamin B12    Standing Status:   Future    Number of Occurrences:   1    Standing Expiration Date:   09/06/2022  Technologist smear review    Standing Status:   Future    Number of Occurrences:   1    Standing Expiration Date:   09/06/2022   CBC with Differential/Platelet    Standing Status:   Future    Number of Occurrences:   1    Standing Expiration Date:   09/06/2022   Multiple Myeloma Panel (SPEP&IFE w/QIG)    Standing Status:   Future    Number of Occurrences:   1    Standing Expiration Date:   09/06/2022   Kappa/lambda light chains    Standing Status:   Future    Number of Occurrences:   1    Standing Expiration Date:   09/06/2022   Flow cytometry panel-leukemia/lymphoma work-up    Standing Status:   Future    Number of Occurrences:   1    Standing Expiration Date:   09/06/2022   HIV Antibody (routine testing w rflx)    Standing Status:   Future    Number of Occurrences:   1    Standing Expiration Date:   09/06/2022   Hepatitis panel, acute    Standing Status:   Future    Number of Occurrences:   1    Standing Expiration Date:   09/06/2022    All questions were answered. The patient knows to call the clinic with any problems questions or concerns.  cc Flinchum, Kelby Aline, F*    Return of visit: follow up in January 2022 Thank you for this kind referral and the opportunity to participate in the care of this patient. A copy of today's note is routed to referring provider   Earlie Server, MD, PhD [facility] Hematology Oncology 09/06/2021

## 2021-09-06 NOTE — Progress Notes (Signed)
Pt here to establish care.

## 2021-09-07 LAB — KAPPA/LAMBDA LIGHT CHAINS
Kappa free light chain: 18.5 mg/L (ref 3.3–19.4)
Kappa, lambda light chain ratio: 1.32 (ref 0.26–1.65)
Lambda free light chains: 14 mg/L (ref 5.7–26.3)

## 2021-09-08 LAB — COMP PANEL: LEUKEMIA/LYMPHOMA

## 2021-09-13 LAB — MULTIPLE MYELOMA PANEL, SERUM
Albumin SerPl Elph-Mcnc: 4.4 g/dL (ref 2.9–4.4)
Albumin/Glob SerPl: 1.4 (ref 0.7–1.7)
Alpha 1: 0.2 g/dL (ref 0.0–0.4)
Alpha2 Glob SerPl Elph-Mcnc: 0.6 g/dL (ref 0.4–1.0)
B-Globulin SerPl Elph-Mcnc: 1.3 g/dL (ref 0.7–1.3)
Gamma Glob SerPl Elph-Mcnc: 1.2 g/dL (ref 0.4–1.8)
Globulin, Total: 3.3 g/dL (ref 2.2–3.9)
IgA: 268 mg/dL (ref 90–386)
IgG (Immunoglobin G), Serum: 1248 mg/dL (ref 603–1613)
IgM (Immunoglobulin M), Srm: 80 mg/dL (ref 20–172)
Total Protein ELP: 7.7 g/dL (ref 6.0–8.5)

## 2021-09-14 ENCOUNTER — Ambulatory Visit: Payer: Managed Care, Other (non HMO) | Admitting: Urology

## 2021-09-14 ENCOUNTER — Other Ambulatory Visit: Payer: Self-pay

## 2021-09-14 ENCOUNTER — Encounter: Payer: Self-pay | Admitting: Urology

## 2021-09-14 VITALS — BP 156/83 | HR 97 | Ht 71.0 in | Wt 247.0 lb

## 2021-09-14 DIAGNOSIS — Z3009 Encounter for other general counseling and advice on contraception: Secondary | ICD-10-CM | POA: Diagnosis not present

## 2021-09-14 MED ORDER — DIAZEPAM 5 MG PO TABS: 5.0000 mg | ORAL_TABLET | Freq: Once | ORAL | 0 refills | Status: DC | PRN

## 2021-09-14 NOTE — Patient Instructions (Signed)

## 2021-09-14 NOTE — Progress Notes (Signed)
° °  09/14/2021 4:43 PM   Paul Silva Dec 07, 1985 [id number]  Reason for visit: History of hypogonadism, discuss vasectomy  HPI: 35 year old male who previously was followed by Dr. Junious Silk for hypogonadism.  He was previously on testosterone as well as Clomid, but has been off these medications for at least a year, and testosterone levels have been normal.   He is interested today in discussing vasectomy.  He has 2 children ages 7 and 81 and he and his wife do not desire any further biologic pregnancies.  On exam, phallus with patent meatus, no lesions, testicles 20 cc and descended bilaterally without masses, thickened cords but vas deferens palpable  We discussed the risks and benefits of vasectomy at length.  Vasectomy is intended to be a permanent form of contraception, and does not produce immediate sterility.  Following vasectomy another form of contraception is required until vas occlusion is confirmed by a post-vasectomy semen analysis obtained 2-3 months after the procedure.  Even after vas occlusion is confirmed, vasectomy is not 100% reliable in preventing pregnancy, and the failure rate is approximately 09/1998.  Repeat vasectomy is required in less than 1% of patients.  He should refrain from ejaculation for 1 week after vasectomy.  Options for fertility after vasectomy include vasectomy reversal, and sperm retrieval with in vitro fertilization or ICSI.  These options are not always successful and may be expensive.  Finally, there are other permanent and non-permanent alternatives to vasectomy available. There is no risk of erectile dysfunction, and the volume of semen will be similar to prior, as the majority of the ejaculate is from the prostate and seminal vesicles.   The procedure takes ~20 minutes.  We recommend patients take 5-10 mg of Valium 30 minutes prior, and he will need a driver post-procedure.  Local anesthetic is injected into the scrotal skin and a small segment of the vas  deferens is removed, and the ends occluded. The complication rate is approximately 1-2%, and includes bleeding, infection, and development of chronic scrotal pain.  PLAN: Schedule vasectomy Valium sent to [place] [address] [place], [place] [address] [phone]

## 2021-09-28 ENCOUNTER — Inpatient Hospital Stay: Payer: Managed Care, Other (non HMO) | Admitting: Oncology

## 2021-10-04 ENCOUNTER — Other Ambulatory Visit: Payer: Managed Care, Other (non HMO)

## 2021-10-12 ENCOUNTER — Other Ambulatory Visit: Payer: Self-pay

## 2021-10-12 ENCOUNTER — Encounter: Payer: Self-pay | Admitting: Urology

## 2021-10-12 ENCOUNTER — Ambulatory Visit (INDEPENDENT_AMBULATORY_CARE_PROVIDER_SITE_OTHER): Payer: Managed Care, Other (non HMO) | Admitting: Urology

## 2021-10-12 VITALS — Ht 71.0 in | Wt 247.0 lb

## 2021-10-12 DIAGNOSIS — Z302 Encounter for sterilization: Secondary | ICD-10-CM | POA: Diagnosis not present

## 2021-10-12 NOTE — Progress Notes (Signed)
VASECTOMY PROCEDURE NOTE:  The patient was taken to the minor procedure room and placed in the supine position. His genitals were prepped and draped in the usual sterile fashion. The right vas deferens was brought up to the skin of the right upper scrotum. The skin overlying it was anesthetized with 1% lidocaine without epinephrine, anesthetic was also injected alongside the vas deferens in the direction of the inguinal canal. The no scalpel vasectomy instrument was used to make a small perforation in the scrotal skin. The vasectomy clamp was used to grasp the vas deferens. It was carefully dissected free from surrounding structures. A 1cm segment of the vas was removed, and the cut ends of the mucosa were cauterized. A figure of eight suture was used to perform fascial interposition. No significant bleeding was noted. The vas deferens was returned to the scrotum. The skin incision was closed with a simple interrupted stitch of 4-0 chromic.  Attention was then turned to the left side. The left vasectomy was performed in the same exact fashion. Sterile dressings were placed over each incision. The patient tolerated the procedure well.  IMPRESSION/DIAGNOSIS: The patient is a 36 year old gentleman who underwent a vasectomy today. Post-procedure instructions were reviewed. I stressed the importance of continuing to use birth control until he provides a semen specimen more than 2 months from now that demonstrates azoospermia.  We discussed return precautions including fever over 101, significant bleeding or hematoma, or uncontrolled pain. I also stressed the importance of avoiding strenuous activity for one week, no sexual activity or ejaculations for 5 days, intermittent icing over the next 48 hours, and scrotal support.   PLAN: The patient will be advised of his semen analysis results when available.   Nickolas Madrid, MD 10/12/2021

## 2021-10-12 NOTE — Patient Instructions (Signed)

## 2021-11-20 ENCOUNTER — Inpatient Hospital Stay: Payer: Managed Care, Other (non HMO) | Admitting: Oncology

## 2021-11-20 ENCOUNTER — Encounter: Payer: Self-pay | Admitting: *Deleted

## 2021-11-27 ENCOUNTER — Encounter: Payer: Self-pay | Admitting: Oncology

## 2021-11-27 ENCOUNTER — Inpatient Hospital Stay: Payer: Managed Care, Other (non HMO) | Attending: Oncology | Admitting: Oncology

## 2021-11-27 ENCOUNTER — Other Ambulatory Visit: Payer: Self-pay | Admitting: Adult Health

## 2021-11-27 DIAGNOSIS — D72819 Decreased white blood cell count, unspecified: Secondary | ICD-10-CM | POA: Diagnosis not present

## 2021-11-27 DIAGNOSIS — E538 Deficiency of other specified B group vitamins: Secondary | ICD-10-CM | POA: Insufficient documentation

## 2021-11-27 DIAGNOSIS — Z79899 Other long term (current) drug therapy: Secondary | ICD-10-CM | POA: Insufficient documentation

## 2021-11-27 DIAGNOSIS — D729 Disorder of white blood cells, unspecified: Secondary | ICD-10-CM

## 2021-11-27 MED ORDER — VITAMIN B-12 1000 MCG PO TABS: 1000.0000 ug | ORAL_TABLET | Freq: Every day | ORAL | 1 refills | Status: DC

## 2021-11-27 NOTE — Progress Notes (Signed)
RN called and verified pt name and date of birth. Pt verbalized understanding of mychart visit today and denies any concerns.   ?

## 2021-11-27 NOTE — Progress Notes (Signed)
HEMATOLOGY-ONCOLOGY TeleHEALTH VISIT PROGRESS NOTE  ?I connected with Paul Silva on 11/27/21  at  1: 35 PM EST by video enabled telemedicine visit and verified that I am speaking with the correct person using two identifiers. ?I discussed the limitations, risks, security and privacy concerns of performing an evaluation and management service by telemedicine and the availability of in-person appointments. The patient expressed understanding and agreed to proceed.  ? ?Other persons participating in the visit and their role in the encounter:  ?None ? ?Patient's location: Home  ?Provider's location: office ?Chief Complaint: leukopenia ? ? ?INTERVAL HISTORY ?Paul Silva is a 36 y.o. male who has above history reviewed by me today presents for follow up visit for management of leukopenia.  ?Patient was connected virtually for short period of time.  ?I attempted to connect the patient for visual enabled telehealth visit.  Due to the technical difficulties with video,  Patient was transitioned to audio only visit. ?He has no complaints. He has not done blood work up  and prefers to group work up along with her pcp's blood orders.  ? ?Review of Systems  ?Constitutional:  Negative for appetite change, chills, fatigue, fever and unexpected weight change.  ?HENT:   Negative for hearing loss and voice change.   ?Eyes:  Negative for eye problems and icterus.  ?Respiratory:  Negative for chest tightness, cough and shortness of breath.   ?Cardiovascular:  Negative for chest pain and leg swelling.  ?Gastrointestinal:  Negative for abdominal distention and abdominal pain.  ?Endocrine: Negative for hot flashes.  ?Genitourinary:  Negative for difficulty urinating, dysuria and frequency.   ?Musculoskeletal:  Negative for arthralgias.  ?Skin:  Negative for itching and rash.  ?Neurological:  Negative for light-headedness and numbness.  ?Hematological:  Negative for adenopathy. Does not bruise/bleed easily.  ?Psychiatric/Behavioral:   Negative for confusion.    ?Past Medical History:  ?Diagnosis Date  ? Abnormal WBC count   ? Allergy   ? Anxiety   ? Asthma   ? Blood pressure elevated without history of HTN   ? Fracture of thumb   ? Frequent headaches   ? Hyperlipidemia   ? Hyperpigmentation   ? Insomnia   ? Interstitial pulmonary disease ([place])   ? Leukopenia   ? Migraines   ? Personal history of COVID-19 02/05/2021  ? Vitamin D deficiency   ? ?Past Surgical History:  ?Procedure Laterality Date  ? none    ?  ?Family History  ?Problem Relation Age of Onset  ? Diabetes Mother   ? Hypertension Father   ? Diabetes Father   ? Prostate cancer Neg Hx   ? Colon cancer Neg Hx   ? Bladder Cancer Neg Hx   ? Kidney cancer Neg Hx   ?  ?Social History  ? ?Socioeconomic History  ? Marital status: Married  ?  Spouse name: Not on file  ? Number of children: Not on file  ? Years of education: Not on file  ? Highest education level: Not on file  ?Occupational History  ? Not on file  ?Tobacco Use  ? Smoking status: Never  ? Smokeless tobacco: Never  ?Vaping Use  ? Vaping Use: Never used  ?Substance and Sexual Activity  ? Alcohol use: Not Currently  ?  Alcohol/week: 0.0 standard drinks  ? Drug use: No  ? Sexual activity: Yes  ?Other Topics Concern  ? Not on file  ?Social History Narrative  ? 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 ?Social Determinants of Health  ? ?Financial Resource Strain: Not on file  ?Food Insecurity: Not on file  ?Transportation Needs: Not on file  ?Physical Activity: Not on file  ?Stress: Not on file  ?Social Connections: Not on file  ?Intimate Partner Violence: Not on file  ?  ?Current Outpatient Medications on File Prior to Visit  ?Medication Sig Dispense Refill  ? buPROPion (WELLBUTRIN XL) 300 MG 24 hr tablet Take 1 tablet (300 mg total) by mouth daily. On the first week take one tablet every other day then can increase to daily dose. 90 tablet 1  ? hydrOXYzine (ATARAX/VISTARIL) 10 MG tablet Take 1 tablet (10 mg total) by  mouth at bedtime as needed for anxiety or vomiting (or as needed for sleep). CAUSES DROWSINESS 30 tablet 0  ? traZODone (DESYREL) 50 MG tablet Take 0.5-1 tablets (25-50 mg total) by mouth at bedtime as needed for sleep. 30 tablet 1  ? ?No current facility-administered medications on file prior to visit.  ?  ?No Known Allergies  ? ?  ?Observations/Objective: ?Today's Vitals  ? 11/27/21 1308  ?PainSc: 0-No pain  ? ?There is no height or weight on file to calculate BMI.  ?Physical Exam ?Neurological:  ?   Mental Status: He is alert.  ? ? ?CBC ?   ?Component Value Date/Time  ? WBC 2.7 (L) 09/06/2021 1033  ? RBC 5.42 09/06/2021 1033  ? HGB 14.1 09/06/2021 1033  ? HGB 14.7 11/13/2019 1228  ? HCT 43.4 09/06/2021 1033  ? HCT 44.8 06/21/2020 0910  ? PLT 168 09/06/2021 1033  ? MCV 80.1 09/06/2021 1033  ? MCH 26.0 09/06/2021 1033  ? MCHC 32.5 09/06/2021 1033  ? RDW 13.2 09/06/2021 1033  ? LYMPHSABS 1.3 09/06/2021 1033  ? MONOABS 0.2 09/06/2021 1033  ? EOSABS 0.1 09/06/2021 1033  ? BASOSABS 0.0 09/06/2021 1033  ?  ?CMP  ?   ?Component Value Date/Time  ? NA 137 08/30/2021 0923  ? K 4.1 08/30/2021 0923  ? CL 102 08/30/2021 0923  ? CO2 29 08/30/2021 0923  ? GLUCOSE 87 08/30/2021 0923  ? BUN 10 08/30/2021 0923  ? CREATININE 1.20 08/30/2021 0923  ? CALCIUM 10.0 08/30/2021 0923  ? PROT 7.4 08/30/2021 0923  ? ALBUMIN 4.6 08/30/2021 0923  ? AST 15 08/30/2021 0923  ? ALT 21 08/30/2021 0923  ? ALKPHOS 71 08/30/2021 0923  ? BILITOT 0.7 08/30/2021 0923  ?  ? ?Assessment and Plan: ?1. Leukopenia, unspecified type   ?2. Low serum vitamin B12   ? ?# Leukopenia, predominantly neutropenia.  ?ANC is 1 ?Previous work up showed ngative SPEP, light chain ratio, flowcytometry, HIV, hepatitis. Normal B12-[low normal end],normal Folate.  ?Likely reactive. Or due to low borderline B12.  ? ?# Low borderline B12 ?Recommend B12 1057mg daily. Rx sent ? ?He prefers to have follow up blood tests done with her PCP. I have communicated with PCP who will check  CBC w diff and B12.  ?He does not need to follow up with me if labs are stable or improved.  ?I am happy to see him in the future if he develops new symptoms or concerns.  ?  ? ?I discussed the assessment and treatment plan with the patient. The patient was provided an opportunity to ask questions and all were answered. The patient agreed with the plan and demonstrated an understanding of the instructions.  ?The patient was advised to call back or seek an in-person evaluation if the symptoms  worsen or if the condition fails to improve as anticipated.  ? ?Earlie Server, MD 11/27/2021 10:43 PM  ? ?

## 2021-11-28 ENCOUNTER — Ambulatory Visit: Payer: Self-pay | Admitting: Adult Health

## 2021-11-28 DIAGNOSIS — Z91199 Patient's noncompliance with other medical treatment and regimen due to unspecified reason: Secondary | ICD-10-CM

## 2021-11-28 NOTE — Progress Notes (Signed)
NO SHOW for appointment for physical scheduled on 11/28/2021 at 8:00 am.  ?

## 2022-01-04 ENCOUNTER — Other Ambulatory Visit: Payer: Managed Care, Other (non HMO)

## 2022-01-04 DIAGNOSIS — Z302 Encounter for sterilization: Secondary | ICD-10-CM

## 2022-01-06 LAB — POST-VAS SPERM EVALUATION,QUAL: Volume: 1.1 mL

## 2022-01-10 ENCOUNTER — Ambulatory Visit: Payer: Managed Care, Other (non HMO) | Admitting: Family

## 2022-01-10 ENCOUNTER — Telehealth: Payer: Self-pay

## 2022-01-10 ENCOUNTER — Encounter: Payer: Self-pay | Admitting: Family

## 2022-01-10 VITALS — BP 122/82 | HR 68 | Temp 97.7°F | Ht 71.0 in | Wt 257.4 lb

## 2022-01-10 DIAGNOSIS — F419 Anxiety disorder, unspecified: Secondary | ICD-10-CM

## 2022-01-10 DIAGNOSIS — R7303 Prediabetes: Secondary | ICD-10-CM | POA: Diagnosis not present

## 2022-01-10 DIAGNOSIS — E538 Deficiency of other specified B group vitamins: Secondary | ICD-10-CM | POA: Diagnosis not present

## 2022-01-10 DIAGNOSIS — E785 Hyperlipidemia, unspecified: Secondary | ICD-10-CM | POA: Diagnosis not present

## 2022-01-10 LAB — HEMOGLOBIN A1C: Hgb A1c MFr Bld: 5.9 % (ref 4.6–6.5)

## 2022-01-10 LAB — LIPID PANEL
Cholesterol: 312 mg/dL — ABNORMAL HIGH (ref 0–200)
HDL: 45 mg/dL (ref >39.00)
LDL Cholesterol: 246 mg/dL — ABNORMAL HIGH (ref 0–99)
NonHDL: 266.82
Total CHOL/HDL Ratio: 7
Triglycerides: 104 mg/dL (ref 0.0–149.0)
VLDL: 20.8 mg/dL (ref 0.0–40.0)

## 2022-01-10 MED ORDER — VITAMIN B-12 1000 MCG PO TABS: 1000.0000 ug | ORAL_TABLET | Freq: Every day | ORAL | 1 refills | Status: DC

## 2022-01-10 NOTE — Patient Instructions (Addendum)
Consider CT calcium score and referral to cardiology ? ?This is  Dr. Lupita Dawn  example of a  "Low GI"  Diet:  It will allow you to lose 4 to 8  lbs  per month if you follow it carefully.  Your goal with exercise is a minimum of 30 minutes of aerobic exercise 5 days per week (Walking does not count once it becomes easy!)  ? ? All of the foods can be found at grocery stores and in bulk at [employer].  The Atkins protein bars and shakes are available in more varieties at Target, [employer] and [place].   ? ? ?7 AM Breakfast:  Choose from the following: ? Low carbohydrate Protein  Shakes (I recommend the  Premier Protein chocolate shakes,  EAS AdvantEdge "Carb Control" shakes  Or the Atkins shakes all are under 3 net carbs)  ?   a scrambled egg/bacon/cheese burrito made with Mission's "carb balance" whole wheat tortilla  (about 10 net carbs ) ? [employer] Deans sells Physicist, medical (basically a quiche without the pastry crust) that is eaten cold and very convenient way to get your eggs.  8 carbs) ? If you make your own protein shakes, avoid bananas and pineapple,  And use low carb greek yogurt or original /unsweetened almond or soy milk  ? ? Avoid cereal and bananas, oatmeal and cream of wheat and grits. They are loaded with carbohydrates! ? ? ?10 AM: high protein snack: ? Protein bar by Atkins (the snack size, under 200 cal, usually < 6 net carbs).   ? A stick of cheese:  Around 1 carb,  100 cal    ? Dannon Light n Fit Greek Yogurt  (80 cal, 8 carbs)  ?Other so called "protein bars" and Greek yogurts tend to be loaded with carbohydrates.  Remember, in food advertising, the word "energy" is synonymous for " carbohydrate." ? ?Lunch:  ? A Sandwich using the bread choices listed, Can use any  Eggs,  lunchmeat, grilled meat or canned tuna), avocado, regular mayo/mustard  and cheese. ? A Salad using blue cheese, ranch,  Goddess or vinagrette,  Avoid taco shells, croutons or "confetti" and no "candied nuts" but regular  nuts OK.  ? No pretzels, nabs  or chips.  Pickles and miniature sweet peppers are a good low carb alternative that provide a "crunch" ? The bread is the only source of carbohydrate in a sandwich and  can be decreased by trying some of the attached alternatives to traditional loaf bread ?  ?Avoid "Low fat dressings, as well as Pastos dressings They are loaded with sugar! ? ? ?3 PM/ Mid day  Snack: ? Consider  1 ounce of  almonds, walnuts, pistachios, pecans, peanuts,  Macadamia nuts or a nut medley. ? Avoid "granola and granola bars " ? Mixed nuts are ok in moderation as long as there are no raisins,  cranberries or dried fruit.  ? KIND bars are OK if you get the low glycemic index variety  ? Try the prosciutto/mozzarella cheese sticks by [employer]  In deli /backery section   High protein  ?  ?  ?6 PM  Dinner:   ?  Meat/fowl/fish with a green salad, and either broccoli, cauliflower, green beans, spinach, brussel sprouts or  Lima beans. DO NOT BREAD THE PROTEIN!!   ?   There is a low carb pasta by Dreamfield's that is acceptable and tastes great: only 5 digestible carbs/serving.( All grocery stores but  BJs carry it ) ?Several ready made meals are available low carb:  ? Try Michel Angelo's chicken piccata or chicken or eggplant parm over low carb pasta.(Lowes and BJs)  ? Marjory Lies Sanchez's "Carnitas" (pulled pork, no sauce,  0 carbs) or his beef pot roast to make a dinner burrito (at [employer]) ? Pesto over low carb pasta (bj's sells a good quality pesto in the center refrigerated section of the deli  ? Try satueeing  Cheral Marker with mushroooms as a good side  ? [employer] makes a mashed cauliflower that tastes like mashed potatoes ? ?Whole wheat pasta is still full of digestible carbs and  Not as low in glycemic index as Dreamfield's.   ?Brown rice is still rice,  So skip the rice and noodles if you eat [place] or [place] (or at least limit to 1/2 cup) ? ?9 PM snack :  ? Breyer's "low carb" fudgsicle or  ice  cream bar (Carb Smart line), or  Weight Watcher's ice cream bar , or another "no sugar added" ice cream; ? a serving of fresh berries/cherries with whipped cream  ? Cheese or DANNON'S LlGHT N FIT GREEK YOGURT ? 8 ounces of [place] unsweetened almond/cococunut milk   ? Treat yourself to a parfait made with whipped cream blueberiies, walnuts and vanilla greek yogurt ? ?Avoid bananas, pineapple, grapes  and watermelon on a regular basis because they are high in sugar.  THINK OF THEM AS DESSERT ? ?Remember that snack Substitutions should be less than 10 NET carbs per serving and meals < 20 carbs. Remember to subtract fiber grams to get the "net carbs." ? ? ? ? ?

## 2022-01-10 NOTE — Telephone Encounter (Signed)
-----   Message from Billey Co, MD sent at 01/09/2022  8:18 AM EDT ----- ?Sperm still present, encourage ejaculations for clearance and repeat more sensitive semen analysis in 6 weeks ? ?Nickolas Madrid, MD ?01/09/2022 ? ? ?

## 2022-01-10 NOTE — Assessment & Plan Note (Addendum)
Chronic, stable. Continue wellbutrin '300mg'$ . Very rare use of trazodone '50mg'$ .  ?

## 2022-01-10 NOTE — Assessment & Plan Note (Signed)
Uncontrolled. Repeating lipid panel today. Discussed with familial history and early ASCVD, having risk stratification with cardiology including considering for CT calcium score. He declines at this time. He is more inclined to start statin and consider form testing in the future. ?

## 2022-01-10 NOTE — Progress Notes (Signed)
? ?Subjective:  ? ? Patient ID: BRIYAN KLEVEN, male    DOB: [birth year], 36 y.o.   MRN: [id number] ? ?CC: KELDRICK POMPLUN is a 36 y.o. male who presents today for follow up.  ? ?HPI: Feels well today ?No new complaints ? ? ?Anxiety- he is compliant with wellbutrin '300mg'$ . He takes trazodone '50mg'$  once per month usually. He has never used atarax ? ?No depression.  ?No increased irritability, anxiety ? ?HLD-  He limits trans and saturated fats. He has recently has lost weight ?He works out 3 times per week ? 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  ? ?HISTORY:  ?Past Medical History:  ?Diagnosis Date  ? Abnormal WBC count   ? Allergy   ? Anxiety   ? Asthma   ? Blood pressure elevated without history of HTN   ? Fracture of thumb   ? Frequent headaches   ? Hyperlipidemia   ? Hyperpigmentation   ? Insomnia   ? Interstitial pulmonary disease ([place])   ? Leukopenia   ? Migraines   ? Personal history of COVID-19 02/05/2021  ? Vitamin D deficiency   ? ?Past Surgical History:  ?Procedure Laterality Date  ? none    ? ?Family History  ?Problem Relation Age of Onset  ? Diabetes Mother   ? Hypertension Father   ? Diabetes Father   ? Heart attack Father 49  ? Prostate cancer Neg Hx   ? Colon cancer Neg Hx   ? Bladder Cancer Neg Hx   ? Kidney cancer Neg Hx   ? ? ?Allergies: Patient has no known allergies. ?Current Outpatient Medications on File Prior to Visit  ?Medication Sig Dispense Refill  ? buPROPion (WELLBUTRIN XL) 300 MG 24 hr tablet Take 1 tablet (300 mg total) by mouth daily. On the first week take one tablet every other day then can increase to daily dose. 90 tablet 1  ? traZODone (DESYREL) 50 MG tablet Take 0.5-1 tablets (25-50 mg total) by mouth at bedtime as needed for sleep. 30 tablet 1  ? ?No current facility-administered medications on file prior to visit.  ? ? ?Social History  ? ?Tobacco Use  ? Smoking status: Never  ? Smokeless tobacco: Never  ?Vaping Use  ? Vaping Use: Never used  ?Substance Use Topics  ? Alcohol  use: Not Currently  ?  Alcohol/week: 0.0 standard drinks  ? Drug use: No  ? ? ?Review of Systems  ?Constitutional:  Negative for chills and fever.  ?Respiratory:  Negative for cough.   ?Cardiovascular:  Negative for chest pain and palpitations.  ?Gastrointestinal:  Negative for nausea and vomiting.  ?   ?Objective:  ?  ?BP 122/82 (BP Location: Left Arm, Patient Position: Sitting, Cuff Size: Normal)   Pulse 68   Temp 97.7 ?F (36.5 ?C) (Oral)   Ht '5\' 11"'$  (1.803 m)   Wt 257 lb 6.4 oz (116.8 kg)   SpO2 99%   BMI 35.90 kg/m?  ?BP Readings from Last 3 Encounters:  ?01/10/22 122/82  ?09/14/21 (!) 156/83  ?09/06/21 136/90  ? ?Wt Readings from Last 3 Encounters:  ?01/10/22 257 lb 6.4 oz (116.8 kg)  ?10/12/21 247 lb (112 kg)  ?09/14/21 247 lb (112 kg)  ? ? ?Physical Exam ?Vitals reviewed.  ?Constitutional:   ?   Appearance: He is well-developed.  ?Cardiovascular:  ?   Rate and Rhythm: Regular rhythm.  ?   Heart sounds: Normal heart sounds.  ?Pulmonary:  ?  Effort: Pulmonary effort is normal. No respiratory distress.  ?   Breath sounds: Normal breath sounds. No wheezing, rhonchi or rales.  ?Skin: ?   General: Skin is warm and dry.  ?Neurological:  ?   Mental Status: He is alert.  ?Psychiatric:     ?   Speech: Speech normal.     ?   Behavior: Behavior normal.  ? ? ?   ?Assessment & Plan:  ? ?Problem List Items Addressed This Visit   ? ?  ? Other  ? Anxiety  ?  Chronic, stable. Continue wellbutrin '300mg'$ . Very rare use of trazodone '50mg'$ .  ? ?  ?  ? B12 deficiency  ?  H/o b12 deficiency. Continue 1040mg b12 qd ? ?  ?  ? Relevant Medications  ? vitamin B-12 (CYANOCOBALAMIN) 1000 MCG tablet  ? Other Relevant Orders  ? Celiac Disease Ab Screen w/Rfx  ? Intrinsic Factor Antibodies  ? Methylmalonic acid, serum  ? Homocysteine  ? Anti-parietal antibody  ? Hyperlipidemia  ?  Uncontrolled. Repeating lipid panel today. Discussed with familial history and early ASCVD, having risk stratification with cardiology including considering  for CT calcium score. He declines at this time. He is more inclined to start statin and consider form testing in the future. ? ?  ?  ? ?Other Visit Diagnoses   ? ? Prediabetes    -  Primary  ? Relevant Orders  ? Hemoglobin A1c (Completed)  ? Lipid panel (Completed)  ? ?  ? ? ? ?I have discontinued Duaine J. Oshana's hydrOXYzine. I am also having him maintain his buPROPion, traZODone, and vitamin B-12. ? ? ?Meds ordered this encounter  ?Medications  ? vitamin B-12 (CYANOCOBALAMIN) 1000 MCG tablet  ?  Sig: Take 1 tablet (1,000 mcg total) by mouth daily.  ?  Dispense:  90 tablet  ?  Refill:  1  ?  Order Specific Question:   Supervising Provider  ?  Answer:   TCrecencio Mc[[id number]]  ? ? ?Return precautions given.  ? ?Risks, benefits, and alternatives of the medications and treatment plan prescribed today were discussed, and patient expressed understanding.  ? ?Education regarding symptom management and diagnosis given to patient on AVS. ? ?Continue to follow with ABurnard Hawthorne FNP for routine health maintenance.  ? ?KVassie Lolland I agreed with plan.  ? ?MMable Paris FNP ? ? ?

## 2022-01-10 NOTE — Telephone Encounter (Signed)
Called pt no answer. Left detailed message per DPR. Advised pt to call back for questions or concerns. Instructions sent via mychart and placed up front for pt pick up.  ? ?Pt called back informed him of the information, per result note. Pt voiced understanding.   ?

## 2022-01-10 NOTE — Assessment & Plan Note (Signed)
H/o b12 deficiency. Continue 1023mg b12 qd ?

## 2022-01-12 ENCOUNTER — Other Ambulatory Visit: Payer: Managed Care, Other (non HMO)

## 2022-01-13 LAB — CELIAC DISEASE AB SCREEN W/RFX
Antigliadin Abs, IgA: 8 units (ref 0–19)
IgA/Immunoglobulin A, Serum: 270 mg/dL (ref 90–386)
Transglutaminase IgA: <2 U/mL (ref 0–3)

## 2022-01-15 ENCOUNTER — Other Ambulatory Visit: Payer: Managed Care, Other (non HMO)

## 2022-01-16 LAB — ANTI-PARIETAL ANTIBODY: PARIETAL CELL AB SCREEN: NEGATIVE

## 2022-01-16 LAB — INTRINSIC FACTOR ANTIBODIES: Intrinsic Factor: NEGATIVE

## 2022-01-16 LAB — HOMOCYSTEINE: Homocysteine: 11.3 umol/L (ref <11.4)

## 2022-01-16 LAB — METHYLMALONIC ACID, SERUM: Methylmalonic Acid, Quant: 80 nmol/L — ABNORMAL LOW (ref 87–318)

## 2022-01-18 ENCOUNTER — Telehealth: Payer: Self-pay | Admitting: Family

## 2022-01-18 NOTE — Telephone Encounter (Signed)
Pt called in requesting result from labs... pt stated that NP Arnett was suppose to send in B12 to his pharmacy... Pt stated that he never received anything... Pt requesting callback...  ?

## 2022-01-19 ENCOUNTER — Telehealth: Payer: Self-pay

## 2022-01-19 NOTE — Telephone Encounter (Signed)
LMTCB to office about B-12 ?

## 2022-01-19 NOTE — Telephone Encounter (Signed)
Spoke to patient and informed him that this b-12 was an OTC medication not a prescription.Patient verbalized understanding ?

## 2022-01-22 ENCOUNTER — Other Ambulatory Visit: Payer: Self-pay | Admitting: Family

## 2022-01-22 DIAGNOSIS — E785 Hyperlipidemia, unspecified: Secondary | ICD-10-CM

## 2022-01-22 DIAGNOSIS — E538 Deficiency of other specified B group vitamins: Secondary | ICD-10-CM

## 2022-01-22 MED ORDER — ROSUVASTATIN CALCIUM 10 MG PO TABS: 10.0000 mg | ORAL_TABLET | Freq: Every evening | ORAL | 3 refills | Status: DC

## 2022-01-23 ENCOUNTER — Other Ambulatory Visit: Payer: Self-pay

## 2022-01-23 DIAGNOSIS — E785 Hyperlipidemia, unspecified: Secondary | ICD-10-CM

## 2022-01-31 ENCOUNTER — Other Ambulatory Visit: Payer: Self-pay | Admitting: Family

## 2022-02-01 LAB — POST VAS SEMEN ANALYSIS, AUA: Volume: 2.5 mL

## 2022-02-02 ENCOUNTER — Encounter: Payer: Self-pay | Admitting: Family

## 2022-02-13 ENCOUNTER — Encounter: Payer: Self-pay | Admitting: Family

## 2022-02-13 DIAGNOSIS — F419 Anxiety disorder, unspecified: Secondary | ICD-10-CM

## 2022-02-14 MED ORDER — BUPROPION HCL ER (XL) 300 MG PO TB24: 300.0000 mg | ORAL_TABLET | Freq: Every day | ORAL | 1 refills | Status: DC

## 2022-02-23 ENCOUNTER — Other Ambulatory Visit (INDEPENDENT_AMBULATORY_CARE_PROVIDER_SITE_OTHER): Payer: Managed Care, Other (non HMO)

## 2022-02-23 ENCOUNTER — Other Ambulatory Visit: Payer: Managed Care, Other (non HMO)

## 2022-02-23 DIAGNOSIS — E785 Hyperlipidemia, unspecified: Secondary | ICD-10-CM | POA: Diagnosis not present

## 2022-02-23 LAB — COMPREHENSIVE METABOLIC PANEL
ALT: 23 U/L (ref 0–53)
AST: 15 U/L (ref 0–37)
Albumin: 4.7 g/dL (ref 3.5–5.2)
Alkaline Phosphatase: 66 U/L (ref 39–117)
BUN: 9 mg/dL (ref 6–23)
CO2: 27 mEq/L (ref 19–32)
Calcium: 9.9 mg/dL (ref 8.4–10.5)
Chloride: 102 mEq/L (ref 96–112)
Creatinine, Ser: 1.46 mg/dL (ref 0.40–1.50)
GFR: 62.44 mL/min (ref >60.00)
Glucose, Bld: 89 mg/dL (ref 70–99)
Potassium: 4 mEq/L (ref 3.5–5.1)
Sodium: 136 mEq/L (ref 135–145)
Total Bilirubin: 0.7 mg/dL (ref 0.2–1.2)
Total Protein: 7.5 g/dL (ref 6.0–8.3)

## 2022-04-11 ENCOUNTER — Telehealth: Payer: Self-pay | Admitting: Family

## 2022-04-11 NOTE — Telephone Encounter (Signed)
Call pt Please get more info  I can see he was seen by  Dr Beather Arbour   Dr Beather Arbour would order CT abdomen so Im not sure why he would need a referral. I have not seen patient for  hernia.   Please call Dr Darron Doom office after speaking with patient to facilitate their office scheduling CT abdomen.    Emelda Brothers, MD   [facility]   [place], [place] [address]   Phone: [phone]

## 2022-04-11 NOTE — Telephone Encounter (Signed)
Pt called in stating that he has a stomach hernia that grow in size... Pt stated that he went to [facility] to see about surgery... Pt stated that the provider over at the [facility] facility advised him that he needed a CT scan before they can do the surgery... Pt stated that he was schedule to do the CT Scan until they called and advised him that he needed a referral for the CT scan... Spoke with Azzie Glatter stated that she will send message to NP Arnett and she will call the pt back... Pt requesting callback

## 2022-04-11 NOTE — Telephone Encounter (Signed)
LVM for Dr Darron Doom nurse to call back to office in regards to patient

## 2022-04-11 NOTE — Telephone Encounter (Signed)
Spoke to [place], Dr. Darron Doom nurse and she stated that  the confusion was that they did not see a referral for him to come into their office for consultation, not to have surgery as of yet. Spoke to patient and explained the situation to him as well he verbalized understanding and now has appointmnt to see Dr. Beather Arbour  in regards to Hernia on 04/24/22.

## 2022-04-11 NOTE — Telephone Encounter (Signed)
FYI Pt is seeing you for umbilical hernia and is sch with you 04/13/22  Per notes, he will need a CT abdomen PRIOR to consult with general surgery at [facility].   I didn't order anything/place referral until he was evaluated.   Thanks for seeing him

## 2022-04-11 NOTE — Telephone Encounter (Signed)
Patient is calling to request a referral for CT imaging due to Hernia.

## 2022-04-11 NOTE — Telephone Encounter (Signed)
Patient states he would like to speak with Jenate Martinique, CMA, regarding his referral for a CT scan of stomach.  He states he needs to have CT before 04/24/2022, and there is some confusion.

## 2022-04-13 ENCOUNTER — Encounter: Payer: Self-pay | Admitting: Internal Medicine

## 2022-04-13 ENCOUNTER — Ambulatory Visit: Payer: 59 | Admitting: Internal Medicine

## 2022-04-13 ENCOUNTER — Ambulatory Visit
Admission: RE | Admit: 2022-04-13 | Discharge: 2022-04-13 | Disposition: A | Discharge status: Home / Self Care | Payer: 59 | Source: Ambulatory Visit | Attending: Internal Medicine | Admitting: Internal Medicine

## 2022-04-13 VITALS — BP 128/64 | HR 78 | Temp 98.7°F | Ht 71.0 in | Wt 257.6 lb

## 2022-04-13 DIAGNOSIS — K429 Umbilical hernia without obstruction or gangrene: Secondary | ICD-10-CM | POA: Insufficient documentation

## 2022-04-13 NOTE — Patient Instructions (Addendum)
04/24/2022 Initial consult [place], [place], MD   [place] [place], [place] [address]   [phone] (Work)   [phone] (Fax)    Dr. Zachery Dauer  [facility] - General Surgery [address] [place], [place] [address] Office: [phone]  Fax: [phone]  Umbilical Hernia, Adult  A hernia is a bulge of tissue that pushes through an opening between muscles. An umbilical hernia happens in the abdomen, near the belly button (umbilicus). The hernia may contain tissues from the small intestine, large intestine, or fatty tissue covering the intestines. Umbilical hernias in adults tend to get worse over time, and they require surgical treatment. There are different types of umbilical hernias, including: Indirect hernia. This type is located just above or below the umbilicus. It is the most common type of umbilical hernia in adults. Direct hernia. This type forms through an opening formed by the umbilicus. Reducible hernia. This type of hernia comes and goes. It may be visible only when you strain, lift something heavy, or cough. This type of hernia can be pushed back into the abdomen (reduced). Incarcerated hernia. This type traps abdominal tissue inside the hernia. This type of hernia cannot be reduced. Strangulated hernia. This type of hernia cuts off blood flow to the tissues inside the hernia. The tissues can start to die if this happens. This type of hernia requires emergency treatment. What are the causes? An umbilical hernia happens when tissue inside the abdomen presses on a weak area of the abdominal muscles. What increases the risk? You may have a greater risk of this condition if you: Are obese. Have had several pregnancies. Have a buildup of fluid inside your abdomen. Have had surgery that weakens the abdominal muscles. What are the signs or symptoms? The main symptom of this condition is a  painless bulge at or near the belly button. A reducible hernia may be visible only when you strain, lift something heavy, or cough. Other symptoms may include: Dull pain. A feeling of pressure. Symptoms of a strangulated hernia may include: Pain that gets increasingly worse. Nausea and vomiting. Pain when pressing on the hernia. Skin over the hernia becoming red or purple. Constipation. Blood in the stool. How is this diagnosed? This condition may be diagnosed based on: A physical exam. You may be asked to cough or strain while standing. These actions increase the pressure inside your abdomen and can force the hernia through the opening in your muscles. Your health care provider may try to reduce the hernia by pressing on it. Your symptoms and medical history. How is this treated? Surgery is the only treatment for an umbilical hernia. Surgery for a strangulated hernia is done as soon as possible. If you have a small hernia that is not incarcerated, you may need to lose weight before having surgery. Follow these instructions at home: Lose weight, if told by your health care provider. Do not try to push the hernia back in. Watch your hernia for any changes in color or size. Tell your health care provider if any changes occur. You may need to avoid activities that increase pressure on your hernia. Do not lift anything that is heavier than 10 lb (4.5 kg), or the limit that you are told, until your health care provider says that it is safe. Take over-the-counter and prescription medicines only as told by your health care provider. Keep all follow-up visits. This is important. Contact  a health care provider if: Your hernia gets larger. Your hernia becomes painful. Get help right away if: You develop sudden, severe pain near the area of your hernia. You have pain as well as nausea or vomiting. You have pain and the skin over your hernia changes color. You develop a fever or  chills. Summary A hernia is a bulge of tissue that pushes through an opening between muscles. An umbilical hernia happens near the belly button. Surgery is the only treatment for an umbilical hernia. Do not try to push your hernia back in. Keep all follow-up visits. This is important. This information is not intended to replace advice given to you by your health care provider. Make sure you discuss any questions you have with your health care provider. Document Revised: 04/18/2020 Document Reviewed: 04/18/2020 Elsevier Patient Education  [place].

## 2022-04-13 NOTE — Progress Notes (Signed)
Chief Complaint  Patient presents with   Hernia    Hernia above naval   F/u  1. Paraumbilical hernia noted since 03/10/15 saw Dr. 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 Hernia is above belly button and increasing in size  He can reduce it but causes discomfort nagging at times   Review of Systems  Constitutional:  Negative for weight loss.  HENT:  Negative for hearing loss.   Eyes:  Negative for blurred vision.  Respiratory:  Negative for shortness of breath.   Cardiovascular:  Negative for chest pain.  Gastrointestinal:  Negative for abdominal pain and blood in stool.       +hernia above belly button  Musculoskeletal:  Negative for back pain.  Skin:  Negative for rash.  Neurological:  Negative for headaches.  Psychiatric/Behavioral:  Negative for depression.    Past Medical History:  Diagnosis Date   Abnormal WBC count    Allergy    Anxiety    Asthma    Blood pressure elevated without history of HTN    Fracture of thumb    Frequent headaches    Hyperlipidemia    Hyperpigmentation    Insomnia    Interstitial pulmonary disease (HCC)    Leukopenia    Migraines    Personal history of COVID-19 02/05/2021   Vitamin D deficiency    Past Surgical History:  Procedure Laterality Date   none     Family History  Problem Relation Age of Onset   Diabetes Mother    Hypertension Father    Diabetes Father    Heart attack Father 30   Prostate cancer Neg Hx    Colon cancer Neg Hx    Bladder Cancer Neg Hx    Kidney cancer Neg Hx    Social History   Socioeconomic History   Marital status: Married    Spouse name: Not on file   Number of children: Not on file   Years of education: Not on file   Highest education level: Not on file  Occupational History   Not on file  Tobacco Use   Smoking status: Never   Smokeless tobacco: Never  Vaping Use   Vaping Use: Never used  Substance and Sexual Activity    Alcohol use: Not Currently    Alcohol/week: 0.0 standard drinks of alcohol   Drug use: No   Sexual activity: Yes  Other Topics Concern   Not on file  Social History Narrative   Boy 9   Daughter 7   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   No Known Allergies Recent Results (from the past 2160 hour(s))  Post VAS Semen Analysis, AUA     Status: None   Collection Time: 01/31/22  9:40 AM  Result Value Ref Range   Volume 2.5 Not Estab. mL   Total Immotile Sperm Comment <0.10 x10E6/mL    Comment: No spermatozoa are identified.   Total Motile Sperm Comment x10E6/mL    Comment: No spermatozoa  are identified.                        Reference Interval: 0.00x10E6/mL Post-Vasectomy analysis was performed on an uncentrifuged specimen.   Comprehensive metabolic panel     Status: None   Collection Time: 02/23/22 10:17 AM  Result Value Ref Range   Sodium 136 135 - 145 mEq/L   Potassium 4.0 3.5 - 5.1 mEq/L   Chloride 102 96 - 112 mEq/L   CO2 27 19 - 32 mEq/L   Glucose, Bld 89 70 - 99 mg/dL   BUN 9 6 - 23 mg/dL   Creatinine, Ser 1.46 0.40 - 1.50 mg/dL   Total Bilirubin 0.7 0.2 - 1.2 mg/dL   Alkaline Phosphatase 66 39 - 117 U/L   AST 15 0 - 37 U/L   ALT 23 0 - 53 U/L   Total Protein 7.5 6.0 - 8.3 g/dL   Albumin 4.7 3.5 - 5.2 g/dL   GFR 62.44 >60.00 mL/min    Comment: Calculated using the CKD-EPI Creatinine Equation (2021)   Calcium 9.9 8.4 - 10.5 mg/dL   Objective  Body mass index is 35.93 kg/m. Wt Readings from Last 3 Encounters:  04/13/22 257 lb 9.6 oz (116.8 kg)   01/10/22 257 lb 6.4 oz (116.8 kg)  10/12/21 247 lb (112 kg)   Temp Readings from Last 3 Encounters:  04/13/22 98.7 F (37.1 C) (Oral)  01/10/22 97.7 F (36.5 C) (Oral)  09/06/21 (!) 97.4 F (36.3 C)   BP Readings from Last 3 Encounters:  04/13/22 128/64  01/10/22 122/82  09/14/21 (!) 156/83   Pulse Readings from Last 3 Encounters:  04/13/22 78  01/10/22 68  09/14/21 97    Physical Exam Vitals and nursing note reviewed.  Constitutional:      Appearance: Normal appearance. He is well-developed and well-groomed.  HENT:     Head: Normocephalic and atraumatic.  Eyes:     Conjunctiva/sclera: Conjunctivae normal.     Pupils: Pupils are equal, round, and reactive to light.  Cardiovascular:     Rate and Rhythm: Normal rate and regular rhythm.     Heart sounds: Normal heart sounds.  Pulmonary:     Effort: Pulmonary effort is normal. No respiratory distress.     Breath sounds: Normal breath sounds.  Abdominal:     Tenderness: There is no abdominal tenderness.    Skin:    General: Skin is warm and moist.  Neurological:     General: No focal deficit present.     Mental Status: He is alert and oriented to person, place, and time. Mental status is at baseline.     Sensory: Sensation is intact.     Motor: Motor function is intact.     Coordination: Coordination is intact.     Gait: Gait is intact. Gait normal.  Psychiatric:        Attention and Perception: Attention and perception normal.        Mood and Affect: Mood and affect normal.        Speech: Speech normal.        Behavior: Behavior normal. Behavior is cooperative.        Thought Content: Thought content normal.        Cognition and Memory: Cognition and memory normal.        Judgment: Judgment normal.     Assessment  Plan  Paraumbilical hernia - Plan: Ambulatory referral to General Surgery Dr. Zachery Dauer,  CT ABDOMEN PELVIS WO CONTRAST   Provider: Dr. Olivia Mackie McLean-Scocuzza-Internal Medicine

## 2022-04-20 ENCOUNTER — Ambulatory Visit: Payer: Self-pay | Admitting: General Surgery

## 2022-04-20 NOTE — H&P (View-Only) (Signed)
PATIENT PROFILE: Paul Silva is a 36 y.o. male who presents to the Clinic for consultation at the request of Dr. Terese Door for evaluation of ventral hernia.  PCP:  McLean-Scocuzza, Ginger Organ, MD  HISTORY OF PRESENT ILLNESS: Paul Silva reports he has been feeling a hernia in the anterior abdomen since 2016.  He endorses that he feels a lump above the umbilical area.  This is getting more painful with time.  Pain localized to the supraumbilical area.  There is no pain radiation.  Pain aggravated by eating and certain abdominal wall movement.  Has been no alleviating factors.  Has not been able to reduce the lump recently.  It is painful when he tried to reduce the lump.  Denies any episode of abdominal distention, nausea or vomiting.   PROBLEM LIST: Problem List  Date Reviewed: 09/10/2019  None   GENERAL REVIEW OF SYSTEMS:   General ROS: negative for - chills, fatigue, fever, weight gain or weight loss Allergy and Immunology ROS: negative for - hives  Hematological and Lymphatic ROS: negative for - bleeding problems or bruising, negative for palpable nodes Endocrine ROS: negative for - heat or cold intolerance, hair changes Respiratory ROS: negative for - cough, shortness of breath or wheezing Cardiovascular ROS: no chest pain or palpitations GI ROS: negative for nausea, vomiting, abdominal pain, diarrhea, constipation Musculoskeletal ROS: negative for - joint swelling or muscle pain Neurological ROS: negative for - confusion, syncope Dermatological ROS: negative for pruritus and rash Psychiatric: negative for anxiety, depression, difficulty sleeping and memory loss  MEDICATIONS: Current Outpatient Medications  Medication Sig Dispense Refill   albuterol 90 mcg/actuation inhaler INHALE 1 PUFF BY MOUTH EVERY 6 HOURS AS NEEDED FOR WHEEZING AND FOR SHORTNESS OF BREATH (Patient not taking: Reported on 04/20/2022)     albuterol 90 mcg/actuation inhaler Inhale into the lungs (Patient  not taking: Reported on 04/20/2022)     buPROPion (WELLBUTRIN XL) 300 MG XL tablet TAKE 1 TABLET BY MOUTH EVERY OTHER DAY FOR 7 DAYS THEN INCREASE TO 1 TAB ONCE DAILY THEREAFTER     clomiPHENE (CLOMID) 50 mg tablet TAKE 1 TABLET BY MOUTH EVERY MONDAY AND EVERY THURSDAY AS DIRECTED (Patient not taking: Reported on 04/20/2022)     escitalopram oxalate (LEXAPRO) 10 MG tablet  (Patient not taking: Reported on 04/20/2022)     FLUoxetine (PROZAC) 20 MG capsule TAKE 1 CAPSULE BY MOUTH ONCE DAILY IN THE MORNING (Patient not taking: Reported on 04/20/2022)     hydrOXYzine HCL (ATARAX) 10 MG tablet TAKE 1 TABLET BY MOUTH THREE TIMES DAILY AS NEEDED FOR ANXIETY OR AS NEEDED FOR SLEEP (CAUSES DROWSINESS) (Patient not taking: Reported on 04/20/2022)     ipratropium (ATROVENT) 0.03 % nasal spray  (Patient not taking: Reported on 04/20/2022)     montelukast (SINGULAIR) 10 mg tablet TAKE 1 TABLET BY MOUTH ONCE DAILY IN THE EVENING (Patient not taking: Reported on 04/20/2022)     rosuvastatin (CRESTOR) 10 MG tablet Take 10 mg by mouth every evening     testosterone (TESTIM) 50 mg/5 gram (1 %) gel PLACE 5 GRAMS ONTO THE SKIN DAILY (Patient not taking: Reported on 04/20/2022)     traZODone (DESYREL) 50 MG tablet Take 50 mg by mouth nightly (Patient not taking: Reported on 04/20/2022)     No current facility-administered medications for this visit.    ALLERGIES: Patient has no allergy information on record.  PAST MEDICAL HISTORY: Past Medical History:  Diagnosis Date   Asthma, unspecified asthma severity, unspecified whether  complicated, unspecified whether persistent    Insomnia    Low testosterone    Migraines     PAST SURGICAL HISTORY: No past surgical history on file.   FAMILY HISTORY: Family History  Problem Relation Age of Onset   Diabetes type II Father    High blood pressure (Hypertension) Father      SOCIAL HISTORY: Social History   Socioeconomic History   Marital status: Married  Tobacco Use    Smoking status: Never   Smokeless tobacco: Never  Vaping Use   Vaping Use: Never used  Substance and Sexual Activity   Alcohol use: Yes    PHYSICAL EXAM: Vitals:   04/20/22 0909  BP: (!) 139/101  Pulse: 76   Body mass index is 35.57 kg/m. Weight: (!) 115.7 kg (255 lb)   GENERAL: Alert, active, oriented x3  HEENT: Pupils equal reactive to light. Extraocular movements are intact. Sclera clear. Palpebral conjunctiva normal red color.Pharynx clear.  NECK: Supple with no palpable mass and no adenopathy.  LUNGS: Sound clear with no rales rhonchi or wheezes.  HEART: Regular rhythm S1 and S2 without murmur.  ABDOMEN: Soft and depressible, nontender with no palpable mass, no hepatomegaly.  Palpable lump in the superior aspect of the umbilical area.  Unable to reduce the lump due to pain.  No skin changes.  EXTREMITIES: Well-developed well-nourished symmetrical with no dependent edema.  NEUROLOGICAL: Awake alert oriented, facial expression symmetrical, moving all extremities.  REVIEW OF DATA: I have reviewed the following data today: No visits with results within 3 Month(s) from this visit.  Latest known visit with results is:  No results found for any previous visit.  I evaluated CT scan of the abdomen and pelvis done last week.  This shows a fat-containing ventral hernia.  No sign of obstruction.  ASSESSMENT: Paul Silva is a 36 y.o. male presenting for consultation for ventral hernia.    The patient presents with a symptomatic ventral hernia. Patient was oriented about the diagnosis of ventral hernia and its implication. The patient was oriented about the treatment alternatives (observation vs surgical repair). Due to patient symptoms, repair is recommended. Patient oriented about the surgical procedure, the use of mesh and its risk of complications such as: infection, bleeding, injury to vasculature, injury to bowel or bladder, and chronic pain, intestinal obstruction, among  others.   Ventral hernia without obstruction or gangrene [K43.9]  PLAN:  Robotic assisted laparoscopic ventral hernia repair with mesh (50932) Avoid taking aspirin 5 days before the surgery Contact us if you have any question or concern.   Patient verbalized understanding, all questions were answered, and were agreeable with the plan outlined above.    Herbert Pun, MD  Electronically signed by Herbert Pun, MD

## 2022-04-20 NOTE — H&P (Signed)
PATIENT PROFILE: Paul Silva is a 36 y.o. male who presents to the Clinic for consultation at the request of Dr. Terese Door for evaluation of ventral hernia.  PCP:  Silva, Paul Organ, MD  HISTORY OF PRESENT ILLNESS: Paul Silva reports he has been feeling a hernia in the anterior abdomen since 2016.  He endorses that he feels a lump above the umbilical area.  This is getting more painful with time.  Pain localized to the supraumbilical area.  There is no pain radiation.  Pain aggravated by eating and certain abdominal wall movement.  Has been no alleviating factors.  Has not been able to reduce the lump recently.  It is painful when he tried to reduce the lump.  Denies any episode of abdominal distention, nausea or vomiting.   PROBLEM LIST: Problem List  Date Reviewed: 09/10/2019  None   GENERAL REVIEW OF SYSTEMS:   General ROS: negative for - chills, fatigue, fever, weight gain or weight loss Allergy and Immunology ROS: negative for - hives  Hematological and Lymphatic ROS: negative for - bleeding problems or bruising, negative for palpable nodes Endocrine ROS: negative for - heat or cold intolerance, hair changes Respiratory ROS: negative for - cough, shortness of breath or wheezing Cardiovascular ROS: no chest pain or palpitations GI ROS: negative for nausea, vomiting, abdominal pain, diarrhea, constipation Musculoskeletal ROS: negative for - joint swelling or muscle pain Neurological ROS: negative for - confusion, syncope Dermatological ROS: negative for pruritus and rash Psychiatric: negative for anxiety, depression, difficulty sleeping and memory loss  MEDICATIONS: Current Outpatient Medications  Medication Sig Dispense Refill   albuterol 90 mcg/actuation inhaler INHALE 1 PUFF BY MOUTH EVERY 6 HOURS AS NEEDED FOR WHEEZING AND FOR SHORTNESS OF BREATH (Patient not taking: Reported on 04/20/2022)     albuterol 90 mcg/actuation inhaler Inhale into the lungs (Patient  not taking: Reported on 04/20/2022)     buPROPion (WELLBUTRIN XL) 300 MG XL tablet TAKE 1 TABLET BY MOUTH EVERY OTHER DAY FOR 7 DAYS THEN INCREASE TO 1 TAB ONCE DAILY THEREAFTER     clomiPHENE (CLOMID) 50 mg tablet TAKE 1 TABLET BY MOUTH EVERY MONDAY AND EVERY THURSDAY AS DIRECTED (Patient not taking: Reported on 04/20/2022)     escitalopram oxalate (LEXAPRO) 10 MG tablet  (Patient not taking: Reported on 04/20/2022)     FLUoxetine (PROZAC) 20 MG capsule TAKE 1 CAPSULE BY MOUTH ONCE DAILY IN THE MORNING (Patient not taking: Reported on 04/20/2022)     hydrOXYzine HCL (ATARAX) 10 MG tablet TAKE 1 TABLET BY MOUTH THREE TIMES DAILY AS NEEDED FOR ANXIETY OR AS NEEDED FOR SLEEP (CAUSES DROWSINESS) (Patient not taking: Reported on 04/20/2022)     ipratropium (ATROVENT) 0.03 % nasal spray  (Patient not taking: Reported on 04/20/2022)     montelukast (SINGULAIR) 10 mg tablet TAKE 1 TABLET BY MOUTH ONCE DAILY IN THE EVENING (Patient not taking: Reported on 04/20/2022)     rosuvastatin (CRESTOR) 10 MG tablet Take 10 mg by mouth every evening     testosterone (TESTIM) 50 mg/5 gram (1 %) gel PLACE 5 GRAMS ONTO THE SKIN DAILY (Patient not taking: Reported on 04/20/2022)     traZODone (DESYREL) 50 MG tablet Take 50 mg by mouth nightly (Patient not taking: Reported on 04/20/2022)     No current facility-administered medications for this visit.    ALLERGIES: Patient has no allergy information on record.  PAST MEDICAL HISTORY: Past Medical History:  Diagnosis Date   Asthma, unspecified asthma severity, unspecified whether  complicated, unspecified whether persistent    Insomnia    Low testosterone    Migraines     PAST SURGICAL HISTORY: No past surgical history on file.   FAMILY HISTORY: Family History  Problem Relation Age of Onset   Diabetes type II Father    High blood pressure (Hypertension) Father      SOCIAL HISTORY: Social History   Socioeconomic History   Marital status: Married  Tobacco Use    Smoking status: Never   Smokeless tobacco: Never  Vaping Use   Vaping Use: Never used  Substance and Sexual Activity   Alcohol use: Yes    PHYSICAL EXAM: Vitals:   04/20/22 0909  BP: (!) 139/101  Pulse: 76   Body mass index is 35.57 kg/m. Weight: (!) 115.7 kg (255 lb)   GENERAL: Alert, active, oriented x3  HEENT: Pupils equal reactive to light. Extraocular movements are intact. Sclera clear. Palpebral conjunctiva normal red color.Pharynx clear.  NECK: Supple with no palpable mass and no adenopathy.  LUNGS: Sound clear with no rales rhonchi or wheezes.  HEART: Regular rhythm S1 and S2 without murmur.  ABDOMEN: Soft and depressible, nontender with no palpable mass, no hepatomegaly.  Palpable lump in the superior aspect of the umbilical area.  Unable to reduce the lump due to pain.  No skin changes.  EXTREMITIES: Well-developed well-nourished symmetrical with no dependent edema.  NEUROLOGICAL: Awake alert oriented, facial expression symmetrical, moving all extremities.  REVIEW OF DATA: I have reviewed the following data today: No visits with results within 3 Month(s) from this visit.  Latest known visit with results is:  No results found for any previous visit.  I evaluated CT scan of the abdomen and pelvis done last week.  This shows a fat-containing ventral hernia.  No sign of obstruction.  ASSESSMENT: Paul Silva is a 36 y.o. male presenting for consultation for ventral hernia.    The patient presents with a symptomatic ventral hernia. Patient was oriented about the diagnosis of ventral hernia and its implication. The patient was oriented about the treatment alternatives (observation vs surgical repair). Due to patient symptoms, repair is recommended. Patient oriented about the surgical procedure, the use of mesh and its risk of complications such as: infection, bleeding, injury to vasculature, injury to bowel or bladder, and chronic pain, intestinal obstruction, among  others.   Ventral hernia without obstruction or gangrene [K43.9]  PLAN:  Robotic assisted laparoscopic ventral hernia repair with mesh (16109) Avoid taking aspirin 5 days before the surgery Contact us if you have any question or concern.   Patient verbalized understanding, all questions were answered, and were agreeable with the plan outlined above.    Herbert Pun, MD  Electronically signed by Herbert Pun, MD

## 2022-04-25 ENCOUNTER — Other Ambulatory Visit: Payer: Self-pay

## 2022-04-25 ENCOUNTER — Encounter
Admission: RE | Admit: 2022-04-25 | Discharge: 2022-04-25 | Disposition: A | Discharge status: Home / Self Care | Payer: 59 | Source: Ambulatory Visit | Attending: General Surgery | Admitting: General Surgery

## 2022-04-25 NOTE — Telephone Encounter (Signed)
Spoke to patient and he is going to talk with surgeon first and then contact us back if he needs to do so

## 2022-04-25 NOTE — Patient Instructions (Addendum)
Your procedure is scheduled on: 8/4/ 2023  Report to the Registration Desk on the 1st floor of the [facility]. To find out your arrival time, please call [phone] between 1PM - 3PM on: 8/3 /2023  If your arrival time is 6:00 am, do not arrive prior to that time as the [facility] entrance doors do not open until 6:00 am.  REMEMBER: Instructions that are not followed completely may result in serious medical risk, up to and including death; or upon the discretion of your surgeon and anesthesiologist your surgery may need to be rescheduled.  Do not eat food after midnight the night before surgery.  No gum chewing, lozengers or hard candies.    TAKE THESE MEDICATIONS THE MORNING OF SURGERY WITH A SIP OF WATER: buPROPion (WELLBUTRIN XL  Follow recommendations from Cardiologist, Pulmonologist or PCP regarding stopping Aspirin, Coumadin, Plavix, Eliquis, Pradaxa, or Pletal.  One week prior to surgery: Stop Anti-inflammatories (NSAIDS) such as Advil, Aleve, Ibuprofen, Motrin, Naproxen, Naprosyn and Aspirin based products such as Excedrin, Goodys Powder, BC Powder. Stop ANY OVER THE COUNTER supplements until after surgery like B-12 You may however, continue to take Tylenol if needed for pain up until the day of surgery.  No Alcohol for 24 hours before or after surgery.  No Smoking including e-cigarettes for 24 hours prior to surgery.  No chewable tobacco products for at least 6 hours prior to surgery.  No nicotine patches on the day of surgery.  Do not use any "recreational" drugs for at least a week prior to your surgery.  Please be advised that the combination of cocaine and anesthesia may have negative outcomes, up to and including death. If you test positive for cocaine, your surgery will be cancelled.  On the morning of surgery brush your teeth with toothpaste and water, you may rinse your mouth with mouthwash if you wish. Do not swallow any toothpaste or mouthwash.  Use CHG  Soap or wipes as directed on instruction sheet.  Do not wear jewelry, make-up, hairpins, clips or nail polish.  Do not wear lotions, powders, or perfumes.   Do not shave body from the neck down 48 hours prior to surgery just in case you cut yourself which could leave a site for infection.  Also, freshly shaved skin may become irritated if using the CHG soap.  Contact lenses, hearing aids and dentures may not be worn into surgery.  Do not bring valuables to the hospital. [facility] is not responsible for any missing/lost belongings or valuables.     Notify your doctor if there is any change in your medical condition (cold, fever, infection).  Wear comfortable clothing (specific to your surgery type) to the hospital.  After surgery, you can help prevent lung complications by doing breathing exercises.  Take deep breaths and cough every 1-2 hours. Your doctor may order a device called an Incentive Spirometer to help you take deep breaths.  If you are being admitted to the hospital overnight, leave your suitcase in the car. After surgery it may be brought to your room.  If you are being discharged the day of surgery, you will not be allowed to drive home. You will need a responsible adult (18 years or older) to drive you home and stay with you that night.   If you are taking public transportation, you will need to have a responsible adult (18 years or older) with you. Please confirm with your physician that it is acceptable to use public  transportation.   Please call the [facility] at [phone] if you have any questions about these instructions.  Surgery Visitation Policy:  Patients undergoing a surgery or procedure may have two family members or support persons with them as long as the person is not COVID-19 positive or experiencing its symptoms.   Inpatient Visitation:    Visiting hours are 7 a.m. to 8 p.m. Up to four visitors are allowed at one time in a  patient room, including children. The visitors may rotate out with other people during the day. One designated support person (adult) may remain overnight.

## 2022-04-25 NOTE — Telephone Encounter (Signed)
Pt called in stating that his job is requesting for disability paperwork to be out for surgery.... Pt stated that his job advised him to go to pcp to get the disability forms filled out... Pt was wondering who needs to fill out the forms... Pt stated that he is getting the run arounds about the forms being filled out... Pt requesting callback

## 2022-04-25 NOTE — Telephone Encounter (Signed)
As discussed, ask him to reach out to surgeon first for paperwork

## 2022-04-26 MED ORDER — LACTATED RINGERS IV SOLN: INTRAVENOUS | Status: DC

## 2022-04-26 MED ORDER — ORAL CARE MOUTH RINSE: 15.0000 mL | Freq: Once | OROMUCOSAL | Status: AC

## 2022-04-26 MED ORDER — CHLORHEXIDINE GLUCONATE 0.12 % MT SOLN: 15.0000 mL | Freq: Once | OROMUCOSAL | Status: AC

## 2022-04-26 MED ORDER — CEFAZOLIN SODIUM-DEXTROSE 2-4 GM/100ML-% IV SOLN
2.0000 g | INTRAVENOUS | Status: AC
  Administered 2022-04-27: 2 g via INTRAVENOUS

## 2022-04-27 ENCOUNTER — Other Ambulatory Visit: Payer: Self-pay

## 2022-04-27 ENCOUNTER — Ambulatory Visit: Payer: No Typology Code available for payment source | Admitting: Anesthesiology

## 2022-04-27 ENCOUNTER — Encounter: Payer: Self-pay | Admitting: General Surgery

## 2022-04-27 ENCOUNTER — Encounter
Admission: RE | Disposition: A | Discharge status: Home / Self Care | Payer: Self-pay | Source: Home / Self Care | Attending: General Surgery

## 2022-04-27 ENCOUNTER — Ambulatory Visit
Admission: RE | Admit: 2022-04-27 | Discharge: 2022-04-27 | Disposition: A | Discharge status: Home / Self Care | Payer: No Typology Code available for payment source | Attending: General Surgery | Admitting: General Surgery

## 2022-04-27 DIAGNOSIS — K420 Umbilical hernia with obstruction, without gangrene: Secondary | ICD-10-CM | POA: Insufficient documentation

## 2022-04-27 DIAGNOSIS — E785 Hyperlipidemia, unspecified: Secondary | ICD-10-CM | POA: Diagnosis not present

## 2022-04-27 DIAGNOSIS — K439 Ventral hernia without obstruction or gangrene: Secondary | ICD-10-CM | POA: Diagnosis present

## 2022-04-27 DIAGNOSIS — Z8616 Personal history of COVID-19: Secondary | ICD-10-CM | POA: Insufficient documentation

## 2022-04-27 DIAGNOSIS — J45909 Unspecified asthma, uncomplicated: Secondary | ICD-10-CM | POA: Insufficient documentation

## 2022-04-27 DIAGNOSIS — K436 Other and unspecified ventral hernia with obstruction, without gangrene: Secondary | ICD-10-CM | POA: Diagnosis not present

## 2022-04-27 HISTORY — PX: INSERTION OF MESH: SHX5868

## 2022-04-27 HISTORY — PX: XI ROBOTIC ASSISTED VENTRAL HERNIA: SHX6789

## 2022-04-27 SURGERY — REPAIR, HERNIA, VENTRAL, ROBOT-ASSISTED
Anesthesia: General | Site: Abdomen

## 2022-04-27 MED ORDER — FENTANYL CITRATE (PF) 100 MCG/2ML IJ SOLN
INTRAMUSCULAR | Status: AC
  Filled 2022-04-27: qty 2

## 2022-04-27 MED ORDER — BUPIVACAINE-EPINEPHRINE (PF) 0.25% -1:200000 IJ SOLN
INTRAMUSCULAR | Status: AC
  Filled 2022-04-27: qty 30

## 2022-04-27 MED ORDER — CHLORHEXIDINE GLUCONATE 0.12 % MT SOLN
OROMUCOSAL | Status: AC
  Administered 2022-04-27: 15 mL via OROMUCOSAL
  Filled 2022-04-27: qty 15

## 2022-04-27 MED ORDER — SUGAMMADEX SODIUM 200 MG/2ML IV SOLN
INTRAVENOUS | Status: DC | PRN
  Administered 2022-04-27: 250 mg via INTRAVENOUS

## 2022-04-27 MED ORDER — HYDROCODONE-ACETAMINOPHEN 5-325 MG PO TABS: 1.0000 | ORAL_TABLET | ORAL | 0 refills | Status: AC | PRN

## 2022-04-27 MED ORDER — FENTANYL CITRATE (PF) 100 MCG/2ML IJ SOLN
INTRAMUSCULAR | Status: DC | PRN
  Administered 2022-04-27 (×2): 50 ug via INTRAVENOUS

## 2022-04-27 MED ORDER — ROCURONIUM BROMIDE 100 MG/10ML IV SOLN
INTRAVENOUS | Status: DC | PRN
  Administered 2022-04-27: 60 mg via INTRAVENOUS
  Administered 2022-04-27: 10 mg via INTRAVENOUS

## 2022-04-27 MED ORDER — CEFAZOLIN SODIUM-DEXTROSE 2-4 GM/100ML-% IV SOLN
INTRAVENOUS | Status: AC
  Filled 2022-04-27: qty 100

## 2022-04-27 MED ORDER — PHENYLEPHRINE 80 MCG/ML (10ML) SYRINGE FOR IV PUSH (FOR BLOOD PRESSURE SUPPORT)
PREFILLED_SYRINGE | INTRAVENOUS | Status: AC
  Filled 2022-04-27: qty 10

## 2022-04-27 MED ORDER — PROPOFOL 10 MG/ML IV BOLUS
INTRAVENOUS | Status: AC
  Filled 2022-04-27: qty 20

## 2022-04-27 MED ORDER — MIDAZOLAM HCL 2 MG/2ML IJ SOLN
2.0000 mg | Freq: Once | INTRAMUSCULAR | Status: AC
  Administered 2022-04-27: 2 mg via INTRAVENOUS

## 2022-04-27 MED ORDER — ACETAMINOPHEN 10 MG/ML IV SOLN
INTRAVENOUS | Status: DC | PRN
  Administered 2022-04-27: 1000 mg via INTRAVENOUS

## 2022-04-27 MED ORDER — BUPIVACAINE-EPINEPHRINE (PF) 0.25% -1:200000 IJ SOLN
INTRAMUSCULAR | Status: DC | PRN
  Administered 2022-04-27: 30 mL

## 2022-04-27 MED ORDER — LIDOCAINE HCL (CARDIAC) PF 100 MG/5ML IV SOSY
PREFILLED_SYRINGE | INTRAVENOUS | Status: DC | PRN
  Administered 2022-04-27: 100 mg via INTRAVENOUS

## 2022-04-27 MED ORDER — MIDAZOLAM HCL 2 MG/2ML IJ SOLN
INTRAMUSCULAR | Status: AC
  Filled 2022-04-27: qty 2

## 2022-04-27 MED ORDER — MIDAZOLAM HCL 2 MG/2ML IJ SOLN: 0.5000 mg | Freq: Once | INTRAMUSCULAR | Status: DC

## 2022-04-27 MED ORDER — DEXAMETHASONE SODIUM PHOSPHATE 10 MG/ML IJ SOLN
INTRAMUSCULAR | Status: DC | PRN
  Administered 2022-04-27: 10 mg via INTRAVENOUS

## 2022-04-27 MED ORDER — ONDANSETRON HCL 4 MG/2ML IJ SOLN
INTRAMUSCULAR | Status: DC | PRN
  Administered 2022-04-27: 4 mg via INTRAVENOUS

## 2022-04-27 MED ORDER — ACETAMINOPHEN 10 MG/ML IV SOLN
INTRAVENOUS | Status: AC
  Filled 2022-04-27: qty 100

## 2022-04-27 MED ORDER — GLYCOPYRROLATE 0.2 MG/ML IJ SOLN
INTRAMUSCULAR | Status: DC | PRN
  Administered 2022-04-27: .1 mg via INTRAVENOUS

## 2022-04-27 MED ORDER — OXYCODONE HCL 5 MG PO TABS
5.0000 mg | ORAL_TABLET | Freq: Once | ORAL | Status: AC
  Administered 2022-04-27: 5 mg via ORAL

## 2022-04-27 MED ORDER — OXYCODONE HCL 5 MG PO TABS
ORAL_TABLET | ORAL | Status: AC
  Filled 2022-04-27: qty 1

## 2022-04-27 MED ORDER — DEXMEDETOMIDINE HCL IN NACL 200 MCG/50ML IV SOLN
INTRAVENOUS | Status: DC | PRN
  Administered 2022-04-27 (×2): 8 ug via INTRAVENOUS

## 2022-04-27 MED ORDER — PHENYLEPHRINE HCL (PRESSORS) 10 MG/ML IV SOLN
INTRAVENOUS | Status: DC | PRN
  Administered 2022-04-27: 80 ug via INTRAVENOUS
  Administered 2022-04-27: 160 ug via INTRAVENOUS
  Administered 2022-04-27: 80 ug via INTRAVENOUS

## 2022-04-27 MED ORDER — DEXMEDETOMIDINE HCL IN NACL 80 MCG/20ML IV SOLN
INTRAVENOUS | Status: AC
Start: 2022-04-27 — End: ?
  Filled 2022-04-27: qty 20

## 2022-04-27 MED ORDER — GLYCOPYRROLATE 0.2 MG/ML IJ SOLN
INTRAMUSCULAR | Status: AC
Start: 2022-04-27 — End: ?
  Filled 2022-04-27: qty 1

## 2022-04-27 MED ORDER — ROCURONIUM BROMIDE 10 MG/ML (PF) SYRINGE
PREFILLED_SYRINGE | INTRAVENOUS | Status: AC
Start: 2022-04-27 — End: ?
  Filled 2022-04-27: qty 10

## 2022-04-27 MED ORDER — ONDANSETRON HCL 4 MG/2ML IJ SOLN: 4.0000 mg | Freq: Once | INTRAMUSCULAR | Status: DC | PRN

## 2022-04-27 MED ORDER — PROPOFOL 10 MG/ML IV BOLUS
INTRAVENOUS | Status: DC | PRN
  Administered 2022-04-27: 200 mg via INTRAVENOUS

## 2022-04-27 MED ORDER — FENTANYL CITRATE (PF) 100 MCG/2ML IJ SOLN
25.0000 ug | INTRAMUSCULAR | Status: DC | PRN
  Administered 2022-04-27: 25 ug via INTRAVENOUS

## 2022-04-27 SURGICAL SUPPLY — 45 items
ADH SKN CLS APL DERMABOND .7 (GAUZE/BANDAGES/DRESSINGS) ×2
BAG PRESSURE INF REUSE 1000 (BAG) IMPLANT
COVER TIP SHEARS 8 DVNC (MISCELLANEOUS) ×2 IMPLANT
COVER TIP SHEARS 8MM DA VINCI (MISCELLANEOUS) ×3
COVER WAND RF STERILE (DRAPES) ×3 IMPLANT
DERMABOND ADVANCED (GAUZE/BANDAGES/DRESSINGS) ×3
DERMABOND ADVANCED .7 DNX12 (GAUZE/BANDAGES/DRESSINGS) ×2 IMPLANT
DRAPE ARM DVNC X/XI (DISPOSABLE) ×6 IMPLANT
DRAPE COLUMN DVNC XI (DISPOSABLE) ×2 IMPLANT
DRAPE DA VINCI XI ARM (DISPOSABLE) ×9
DRAPE DA VINCI XI COLUMN (DISPOSABLE) ×3
ELECT REM PT RETURN 9FT ADLT (ELECTROSURGICAL) ×3
ELECTRODE REM PT RTRN 9FT ADLT (ELECTROSURGICAL) ×2 IMPLANT
GLOVE BIO SURGEON STRL SZ 6.5 (GLOVE) ×8 IMPLANT
GLOVE BIOGEL PI IND STRL 6.5 (GLOVE) ×4 IMPLANT
GLOVE BIOGEL PI INDICATOR 6.5 (GLOVE) ×4
GOWN STRL REUS W/ TWL LRG LVL3 (GOWN DISPOSABLE) ×6 IMPLANT
GOWN STRL REUS W/TWL LRG LVL3 (GOWN DISPOSABLE) ×12
IRRIGATOR SUCT 8 DISP DVNC XI (IRRIGATION / IRRIGATOR) IMPLANT
IRRIGATOR SUCTION 8MM XI DISP (IRRIGATION / IRRIGATOR)
IV NS 1000ML (IV SOLUTION)
IV NS 1000ML BAXH (IV SOLUTION) IMPLANT
KIT PINK PAD W/HEAD ARE REST (MISCELLANEOUS) ×3
KIT PINK PAD W/HEAD ARM REST (MISCELLANEOUS) ×2 IMPLANT
LABEL OR SOLS (LABEL) ×3 IMPLANT
MANIFOLD NEPTUNE II (INSTRUMENTS) ×3 IMPLANT
MESH PROGRIP HERNIA FLAT 30X15 (Mesh General) ×1 IMPLANT
NDL INSUFFLATION 14GA 120MM (NEEDLE) ×2 IMPLANT
NEEDLE HYPO 22GX1.5 SAFETY (NEEDLE) ×3 IMPLANT
NEEDLE INSUFFLATION 14GA 120MM (NEEDLE) ×3 IMPLANT
OBTURATOR OPTICAL STANDARD 8MM (TROCAR) ×3
OBTURATOR OPTICAL STND 8 DVNC (TROCAR) ×2
OBTURATOR OPTICALSTD 8 DVNC (TROCAR) ×2 IMPLANT
PACK LAP CHOLECYSTECTOMY (MISCELLANEOUS) ×3 IMPLANT
SEAL CANN UNIV 5-8 DVNC XI (MISCELLANEOUS) ×6 IMPLANT
SEAL XI 5MM-8MM UNIVERSAL (MISCELLANEOUS) ×9
SET TUBE SMOKE EVAC HIGH FLOW (TUBING) ×3 IMPLANT
SOLUTION ELECTROLUBE (MISCELLANEOUS) ×3 IMPLANT
SUT MNCRL AB 4-0 PS2 18 (SUTURE) ×4 IMPLANT
SUT STRATAFIX PDS 30 CT-1 (SUTURE) ×3 IMPLANT
SUT V-LOC 90 ABS 3-0 VLT  V-20 (SUTURE) ×3
SUT V-LOC 90 ABS 3-0 VLT V-20 (SUTURE) IMPLANT
TAPE TRANSPORE STRL 2 31045 (GAUZE/BANDAGES/DRESSINGS) ×3 IMPLANT
TRAY FOLEY MTR SLVR 16FR STAT (SET/KITS/TRAYS/PACK) IMPLANT
WATER STERILE IRR 500ML POUR (IV SOLUTION) ×2 IMPLANT

## 2022-04-27 NOTE — Transfer of Care (Signed)
Immediate Anesthesia Transfer of Care Note  Patient: Paul Silva  Procedure(s) Performed: XI ROBOTIC ASSISTED VENTRAL HERNIA (Abdomen) INSERTION OF MESH  Patient Location: PACU  Anesthesia Type:General  Level of Consciousness: drowsy  Airway & Oxygen Therapy: Patient Spontanous Breathing and Patient connected to face mask oxygen  Post-op Assessment: Report given to RN and Post -op Vital signs reviewed and stable  Post vital signs: Reviewed and stable  Last Vitals:  Vitals Value Taken Time  BP 124/86 04/27/22 0955  Temp 36.7 C 04/27/22 0955  Pulse 79 04/27/22 0958  Resp 19 04/27/22 0958  SpO2 100 % 04/27/22 0958  Vitals shown include unvalidated device data.  Last Pain:  Vitals:   04/27/22 0955  TempSrc:   PainSc: Asleep         Complications: No notable events documented.

## 2022-04-27 NOTE — Op Note (Signed)
Preoperative diagnosis: Umbilical and Ventral Hernia   Postoperative diagnosis: Umbilical and Ventral Hernia   Procedure: Robotic assisted laparoscopic transabdominal pre peritoneal umbilical and ventral incarcerated hernia repair with mesh  Anesthesia: General   Surgeon: Herbert Pun, MD, FACS   Wound Classification: Clean   Specimen: None   Complications: None   Estimated Blood Loss: 5 mL   Indications: A 36 year old male with symptomatic incarcerated umbilical and ventral hernias. Repair indicated to improve pain and avoid complications such as strangulation.    Findings: A 1 cm umbilical hernia and a 2 cm ventral hernias They were 6 cm apart 3. Tension free repair achieved with 15 x 10 cm Progrip mesh 4. Adequate hemostasis   Page 1: Ventral hernia with incarcerated omental fat Page 2 and 3: Ventral hernia with incarcerated fat Page 4: Small umbilical and ventral hernia after reduced content Page 5: hernia repair with mesh in place.              Description of procedure: The patient was brought to the operating room and general anesthesia was induced. A time-out was completed verifying correct patient, procedure, site, positioning, and implant(s) and/or special equipment prior to beginning this procedure. Antibiotics were administered prior to making the incision. SCDs placed. The anterior abdominal wall was prepped and draped in the standard sterile fashion.    Palmer's point chosen for entry.  Veress needle placed and abdomen insufflated to 15cm without any dramatic increase in pressure.  Needle removed and optiview technique used to place 8 mm port at same point.  No injury noted during placement. Two additional ports were placed along left lateral aspect.  Xi robot then docked into place.     A pre peritoneal flap was started 6 cm lateral to the ventral and umbilical defect. The pre peritoneal flap was extended 6 cm away from the ventral hernia cephalad  and umbilical hernia caudally. The Hernia sac and content were reduced.    Insufflation dropped to 8 mm and transfacial suture with 0 stratafix used to primarily close defects under minimal tension. Progrip 15 x 10 cm  mesh was placed within the the pre peritoneal flap and self attached to the anterior abdominal wall centered over the defect.  The pre peritoneal flap was closed using 2-0 VLock.     Robot was undocked.  Abdomen then desufflated while camera within abdomen to ensure no signs of new bleed prior to removing camera and rest of ports completely.  All skin incisions closed with 4-0 Monocryl in a subcuticular fashion.  All wounds then dressed with Dermabond.   Patient was then successfully awakened and transferred to PACU in stable condition.  At the end of the procedure sponge and instrument counts were correct.

## 2022-04-27 NOTE — Discharge Instructions (Addendum)
  Diet: Resume home heart healthy regular diet.   Activity: No heavy lifting >20 pounds (children, pets, laundry, garbage) or strenuous activity until follow-up, but light activity and walking are encouraged. Do not drive or drink alcohol if taking narcotic pain medications.  Wound care: May shower with soapy water and pat dry (do not rub incisions), but no baths or submerging incision underwater until follow-up. (no swimming)   Medications: Resume all home medications. For mild to moderate pain: acetaminophen (Tylenol) ***or ibuprofen (if no kidney disease). Combining Tylenol with alcohol can substantially increase your risk of causing liver disease. Narcotic pain medications, if prescribed, can be used for severe pain, though may cause nausea, constipation, and drowsiness. Do not combine Tylenol and Norco within a 6 hour period as Norco contains Tylenol. If you do not need the narcotic pain medication, you do not need to fill the prescription.  Call office ([phone]) at any time if any questions, worsening pain, fevers/chills, bleeding, drainage from incision site, or other concerns.   AMBULATORY SURGERY  DISCHARGE INSTRUCTIONS   The drugs that you were given will stay in your system until tomorrow so for the next 24 hours you should not:  Drive an automobile Make any legal decisions Drink any alcoholic beverage   You may resume regular meals tomorrow.  Today it is better to start with liquids and gradually work up to solid foods.  You may eat anything you prefer, but it is better to start with liquids, then soup and crackers, and gradually work up to solid foods.   Please notify your doctor immediately if you have any unusual bleeding, trouble breathing, redness and pain at the surgery site, drainage, fever, or pain not relieved by medication.    Additional Instructions:        Please contact your physician with any problems or Same Day Surgery at [phone], Monday  through Friday 6 am to 4 pm, or [facility] number at [phone].  

## 2022-04-27 NOTE — Anesthesia Procedure Notes (Signed)
Procedure Name: Intubation Date/Time: 04/27/2022 7:47 AM  Performed by: Cammie Sickle, CRNAPre-anesthesia Checklist: Patient identified, Patient being monitored, Timeout performed, Emergency Drugs available and Suction available Patient Re-evaluated:Patient Re-evaluated prior to induction Oxygen Delivery Method: Circle system utilized Preoxygenation: Pre-oxygenation with 100% oxygen Induction Type: IV induction Ventilation: Mask ventilation without difficulty and Oral airway inserted - appropriate to patient size Laryngoscope Size: 3 and McGraph Grade View: Grade I Tube type: Oral Tube size: 7.5 mm Number of attempts: 1 Airway Equipment and Method: Stylet Placement Confirmation: ETT inserted through vocal cords under direct vision, positive ETCO2 and breath sounds checked- equal and bilateral Secured at: 21 cm Tube secured with: Tape Dental Injury: Teeth and Oropharynx as per pre-operative assessment

## 2022-04-27 NOTE — Interval H&P Note (Signed)
History and Physical Interval Note:  04/27/2022 7:04 AM  Paul Silva  has presented today for surgery, with the diagnosis of K43.9 Ventral hernia w/o obstruction or gangrene.  The various methods of treatment have been discussed with the patient and family. After consideration of risks, benefits and other options for treatment, the patient has consented to  Procedure(s): XI ROBOTIC [place] (N/A) as a surgical intervention.  The patient's history has been reviewed, patient examined, no change in status, stable for surgery.  I have reviewed the patient's chart and labs.  Questions were answered to the patient's satisfaction.     Herbert Pun

## 2022-04-27 NOTE — Anesthesia Preprocedure Evaluation (Signed)
Anesthesia Evaluation  Patient identified by MRN, date of birth, ID band Patient awake    Reviewed: Allergy & Precautions, H&P , NPO status , Patient's Chart, lab work & pertinent test results, reviewed documented beta blocker date and time   Airway Mallampati: II  TM Distance: >3 FB Neck ROM: full    Dental  (+) Teeth Intact   Pulmonary neg shortness of breath, asthma ,    Pulmonary exam normal        Cardiovascular Exercise Tolerance: Good negative cardio ROS Normal cardiovascular exam Rhythm:regular Rate:Normal     Neuro/Psych Anxiety negative neurological ROS  negative psych ROS   GI/Hepatic negative GI ROS, Neg liver ROS,   Endo/Other  negative endocrine ROS  Renal/GU negative Renal ROS  negative genitourinary   Musculoskeletal   Abdominal   Peds  Hematology negative hematology ROS (+)   Anesthesia Other Findings Past Medical History: No date: Abnormal WBC count No date: Allergy No date: Anxiety No date: Asthma No date: Blood pressure elevated without history of HTN No date: Fracture of thumb No date: Frequent headaches No date: Hyperlipidemia No date: Hyperpigmentation No date: Insomnia No date: Interstitial pulmonary disease (HCC) No date: Leukopenia No date: Migraines 02/05/2021: Personal history of COVID-19 No date: Vitamin D deficiency Past Surgical History: No date: none BMI    Body Mass Index: 35.91 kg/m     Reproductive/Obstetrics negative OB ROS                             Anesthesia Physical Anesthesia Plan  ASA: 2  Anesthesia Plan: General ETT   Post-op Pain Management:    Induction:   PONV Risk Score and Plan:   Airway Management Planned:   Additional Equipment:   Intra-op Plan:   Post-operative Plan:   Informed Consent: I have reviewed the patients History and Physical, chart, labs and discussed the procedure including the risks, benefits  and alternatives for the proposed anesthesia with the patient or authorized representative who has indicated his/her understanding and acceptance.     Dental Advisory Given  Plan Discussed with: CRNA  Anesthesia Plan Comments:         Anesthesia Quick Evaluation

## 2022-04-27 NOTE — Anesthesia Procedure Notes (Signed)
Procedure Name: Intubation Date/Time: 04/27/2022 7:47 AM  Performed by: Cammie Sickle, CRNAPre-anesthesia Checklist: Patient identified, Patient being monitored, Timeout performed, Emergency Drugs available and Suction available Patient Re-evaluated:Patient Re-evaluated prior to induction Oxygen Delivery Method: Circle system utilized Preoxygenation: Pre-oxygenation with 100% oxygen Induction Type: IV induction Ventilation: Mask ventilation without difficulty and Oral airway inserted - appropriate to patient size Laryngoscope Size: 3 and McGraph Grade View: Grade I Tube type: Oral Tube size: 7.5 mm Number of attempts: 1 Airway Equipment and Method: Stylet Placement Confirmation: ETT inserted through vocal cords under direct vision, positive ETCO2 and breath sounds checked- equal and bilateral Secured at: 22 cm Tube secured with: Tape Dental Injury: Teeth and Oropharynx as per pre-operative assessment

## 2022-04-29 NOTE — Anesthesia Postprocedure Evaluation (Signed)
Anesthesia Post Note  Patient: Paul Silva  Procedure(s) Performed: XI ROBOTIC ASSISTED VENTRAL HERNIA (Abdomen) INSERTION OF MESH  Patient location during evaluation: PACU Anesthesia Type: General Level of consciousness: awake and alert Pain management: pain level controlled Vital Signs Assessment: post-procedure vital signs reviewed and stable Respiratory status: spontaneous breathing, nonlabored ventilation, respiratory function stable and patient connected to nasal cannula oxygen Cardiovascular status: blood pressure returned to baseline and stable Postop Assessment: no apparent nausea or vomiting Anesthetic complications: no   No notable events documented.   Last Vitals:  Vitals:   04/27/22 1047 04/27/22 1119  BP: (!) 139/98 (!) 117/94  Pulse: 67 70  Resp: 18 18  Temp: (!) 36 C   SpO2: 99% 100%    Last Pain:  Vitals:   04/27/22 1119  TempSrc:   PainSc: [place]

## 2022-04-30 ENCOUNTER — Encounter: Payer: Self-pay | Admitting: General Surgery

## 2022-07-11 ENCOUNTER — Other Ambulatory Visit: Payer: Self-pay

## 2022-07-11 DIAGNOSIS — E785 Hyperlipidemia, unspecified: Secondary | ICD-10-CM

## 2022-07-11 DIAGNOSIS — F419 Anxiety disorder, unspecified: Secondary | ICD-10-CM

## 2022-07-11 MED ORDER — ROSUVASTATIN CALCIUM 10 MG PO TABS: 10.0000 mg | ORAL_TABLET | Freq: Every evening | ORAL | 3 refills | Status: DC

## 2022-07-11 MED ORDER — BUPROPION HCL ER (XL) 300 MG PO TB24: 300.0000 mg | ORAL_TABLET | Freq: Every day | ORAL | 2 refills | Status: DC

## 2022-07-13 ENCOUNTER — Ambulatory Visit: Payer: Managed Care, Other (non HMO) | Admitting: Family

## 2022-07-18 ENCOUNTER — Encounter: Payer: Self-pay | Admitting: Family

## 2022-07-18 ENCOUNTER — Ambulatory Visit: Payer: No Typology Code available for payment source | Admitting: Family

## 2022-07-18 VITALS — BP 126/70 | HR 76 | Temp 98.2°F | Ht 71.0 in | Wt 263.4 lb

## 2022-07-18 DIAGNOSIS — R7309 Other abnormal glucose: Secondary | ICD-10-CM

## 2022-07-18 DIAGNOSIS — J40 Bronchitis, not specified as acute or chronic: Secondary | ICD-10-CM | POA: Diagnosis not present

## 2022-07-18 DIAGNOSIS — E785 Hyperlipidemia, unspecified: Secondary | ICD-10-CM | POA: Diagnosis not present

## 2022-07-18 LAB — POCT GLYCOSYLATED HEMOGLOBIN (HGB A1C): Hemoglobin A1C: 5.9 % — AB (ref 4.0–5.6)

## 2022-07-18 NOTE — Patient Instructions (Addendum)
Start over the counter mucinex DM to break up mucous , suppress cough  As discussed , your work up with pulmonology, Dr Ashby Dawes, is incomplete.   You are due for labs and CT chest to evaluate for sarcoidosis.   Please let me know if you would like to order or arrange follow up with pulmonology

## 2022-07-18 NOTE — Progress Notes (Signed)
Subjective:    Patient ID: Paul Silva, male    DOB: 1986-07-13, 36 y.o.   MRN: [id number]  CC: Paul Silva is a 36 y.o. male who presents today for follow up.   HPI: Cough and congestion x 7 days, improved.  Sinus pressure resolved.  No ear pain, sore throat, fever, chills. Sob, HA, wheezing  Tried Dayquil with some relief.      Anxiety-compliant with Wellbutrin 300 mg.  Feels well on medication.  He is no longer using trazodone   HLD-compliant with Crestor 10 mg  04/2017 pulmonology Dr Ashby Dawes  Chronic cough, possible asthma. -Continue Symbicort.   Mild interstitial changes on CT chest. May be indicative of mild or early sarcoidosis. -Check serum ace, metabolic panel, CT high resolution.  HISTORY:  Past Medical History:  Diagnosis Date   Abnormal WBC count    Allergy    Anxiety    Asthma    Blood pressure elevated without history of HTN    Fracture of thumb    Frequent headaches    Hyperlipidemia    Hyperpigmentation    Insomnia    Interstitial pulmonary disease (HCC)    Leukopenia    Migraines    Personal history of COVID-19 02/05/2021   Vitamin D deficiency    Past Surgical History:  Procedure Laterality Date   INSERTION OF MESH  04/27/2022   Procedure: INSERTION OF MESH;  Surgeon: Herbert Pun, MD;  Location: [facility] ORS;  Service: General;;   none     XI ROBOTIC ASSISTED VENTRAL HERNIA N/A 04/27/2022   Procedure: XI ROBOTIC ASSISTED VENTRAL HERNIA;  Surgeon: Herbert Pun, MD;  Location: [facility] ORS;  Service: General;  Laterality: N/A;   Family History  Problem Relation Age of Onset   Diabetes Mother    Hypertension Father    Diabetes Father    Heart attack Father 57   Prostate cancer Neg Hx    Colon cancer Neg Hx    Bladder Cancer Neg Hx    Kidney cancer Neg Hx     Allergies: Patient has no known allergies. Current Outpatient Medications on File Prior to Visit  Medication Sig Dispense Refill   buPROPion (WELLBUTRIN XL)  300 MG 24 hr tablet Take 1 tablet (300 mg total) by mouth daily. On the first week take one tablet every other day then can increase to daily dose. 90 tablet 2   rosuvastatin (CRESTOR) 10 MG tablet Take 1 tablet (10 mg total) by mouth every evening. 90 tablet 3   vitamin B-12 (CYANOCOBALAMIN) 1000 MCG tablet Take 1 tablet (1,000 mcg total) by mouth daily. 90 tablet 1   No current facility-administered medications on file prior to visit.    Social History   Tobacco Use   Smoking status: Never   Smokeless tobacco: Never  Vaping Use   Vaping Use: Never used  Substance Use Topics   Alcohol use: Not Currently    Alcohol/week: 0.0 standard drinks of alcohol   Drug use: No    Review of Systems  Constitutional:  Negative for chills and fever.  HENT:  Positive for congestion.   Respiratory:  Negative for cough.   Cardiovascular:  Negative for chest pain and palpitations.  Gastrointestinal:  Negative for nausea and vomiting.      Objective:    BP 126/70 (BP Location: Left Arm, Patient Position: Sitting, Cuff Size: Normal)   Pulse 76   Temp 98.2 F (36.8 C) (Oral)   Ht 5' 11" (1.803  m)   Wt 263 lb 6.4 oz (119.5 kg)   SpO2 98%   BMI 36.74 kg/m  BP Readings from Last 3 Encounters:  07/18/22 126/70  04/27/22 (!) 117/94  04/13/22 128/64   Wt Readings from Last 3 Encounters:  07/18/22 263 lb 6.4 oz (119.5 kg)  04/27/22 257 lb 8 oz (116.8 kg)  04/13/22 257 lb 9.6 oz (116.8 kg)    Physical Exam Vitals reviewed.  Constitutional:      Appearance: He is well-developed.  Cardiovascular:     Rate and Rhythm: Regular rhythm.     Heart sounds: Normal heart sounds.  Pulmonary:     Effort: Pulmonary effort is normal. No respiratory distress.     Breath sounds: Normal breath sounds. No wheezing, rhonchi or rales.  Skin:    General: Skin is warm and dry.  Neurological:     Mental Status: He is alert.  Psychiatric:        Speech: Speech normal.        Behavior: Behavior normal.         Assessment & Plan:   Problem List Items Addressed This Visit       Respiratory   Bronchitis    Presentation consistent with viral URI.  Symptoms improving.  Afebrile without shortness of breath or wheezing.  Discussed at length previous evaluation in 2018 with pulmonology which was incomplete for work-up for sarcoidosis.  Patient is no longer on Symbicort.  He politely declines lab (ACE) or CT high-resolution or follow-up pulmonology at this time.  He will consider this at a later date        Other   Hyperlipidemia - Primary    Anticipate improved.  Pending lipid panel, CMP.  Continue Crestor 10mg      Relevant Orders   Lipid panel   Comp Met (CMET)   Other Visit Diagnoses     Elevated hemoglobin A1c       Relevant Orders   POCT HgB A1C (Completed)        I have discontinued [name]'s traZODone. I am also having him maintain his cyanocobalamin, buPROPion, and rosuvastatin.   No orders of the defined types were placed in this encounter.   Return precautions given.   Risks, benefits, and alternatives of the medications and treatment plan prescribed today were discussed, and patient expressed understanding.   Education regarding symptom management and diagnosis given to patient on AVS.  Continue to follow with Arnett, Margaret G, FNP for routine health maintenance.   [name] and I agreed with plan.   Margaret Arnett, FNP   

## 2022-07-18 NOTE — Assessment & Plan Note (Signed)
Presentation consistent with viral URI.  Symptoms improving.  Afebrile without shortness of breath or wheezing.  Discussed at length previous evaluation in 2018 with pulmonology which was incomplete for work-up for sarcoidosis.  Patient is no longer on Symbicort.  He politely declines lab (ACE) or CT high-resolution or follow-up pulmonology at this time.  He will consider this at a later date

## 2022-07-18 NOTE — Assessment & Plan Note (Signed)
Anticipate improved.  Pending lipid panel, CMP.  Continue Crestor '10mg'$ 

## 2022-07-19 ENCOUNTER — Other Ambulatory Visit: Payer: No Typology Code available for payment source

## 2022-07-27 ENCOUNTER — Telehealth: Payer: Self-pay

## 2022-07-27 ENCOUNTER — Other Ambulatory Visit: Payer: Self-pay

## 2022-07-27 ENCOUNTER — Telehealth: Payer: Self-pay | Admitting: Family

## 2022-07-27 DIAGNOSIS — F419 Anxiety disorder, unspecified: Secondary | ICD-10-CM

## 2022-07-27 DIAGNOSIS — E785 Hyperlipidemia, unspecified: Secondary | ICD-10-CM

## 2022-07-27 MED ORDER — ROSUVASTATIN CALCIUM 10 MG PO TABS: 10.0000 mg | ORAL_TABLET | Freq: Every evening | ORAL | 3 refills | Status: DC

## 2022-07-27 MED ORDER — BUPROPION HCL ER (XL) 300 MG PO TB24: 300.0000 mg | ORAL_TABLET | Freq: Every day | ORAL | 2 refills | Status: DC

## 2022-07-27 NOTE — Telephone Encounter (Signed)
[facility] [address] rosuvastatin rosuvastatin (CRESTOR) 10 MG tablet

## 2022-07-27 NOTE — Telephone Encounter (Signed)
Spoke to pt and informed him that  his rx's  for Rosuvastatin and Wellbutrin have been sent in to [facility] on [address]

## 2022-07-27 NOTE — Telephone Encounter (Signed)
Rx's sent pt notified

## 2022-09-05 ENCOUNTER — Telehealth: Payer: Self-pay | Admitting: Family

## 2022-09-05 DIAGNOSIS — G4700 Insomnia, unspecified: Secondary | ICD-10-CM

## 2022-09-05 MED ORDER — TRAZODONE HCL 50 MG PO TABS: 25.0000 mg | ORAL_TABLET | Freq: Every day | ORAL | 1 refills | Status: DC

## 2022-09-05 NOTE — Telephone Encounter (Signed)
Pt want to be called because he is having trouble sleeping

## 2022-09-05 NOTE — Telephone Encounter (Signed)
Call patient Please ask him if he feels Wellbutrin is interfering with his sleep.  Make sure that he is taking Wellbutrin in the morning as it can be stimulating I have sent in a trial of trazodone for sleep.  This medication increases serotonin and histamine and can be helpful for sleep.  He may take a low-dose starting this week, and we can follow-up next week to see how he is feeling

## 2022-09-05 NOTE — Telephone Encounter (Signed)
Spoke to pt about his message.Pt stated that he is not sleeping at all. Would like to know if you could send smthng in for him. He has appt on Mon but says he can't wait until then to sleep?

## 2022-09-06 NOTE — Telephone Encounter (Signed)
Patient stated he will speak with the provider on Monday. He is unable to get his thoughts to turn off.

## 2022-09-07 NOTE — Telephone Encounter (Signed)
noted 

## 2022-09-10 ENCOUNTER — Encounter: Payer: Self-pay | Admitting: Family

## 2022-09-10 ENCOUNTER — Ambulatory Visit: Payer: No Typology Code available for payment source | Admitting: Family

## 2022-09-10 VITALS — BP 126/82 | HR 93 | Temp 98.4°F | Ht 71.0 in | Wt 269.4 lb

## 2022-09-10 DIAGNOSIS — R519 Headache, unspecified: Secondary | ICD-10-CM | POA: Insufficient documentation

## 2022-09-10 DIAGNOSIS — G8929 Other chronic pain: Secondary | ICD-10-CM | POA: Diagnosis not present

## 2022-09-10 DIAGNOSIS — F419 Anxiety disorder, unspecified: Secondary | ICD-10-CM | POA: Diagnosis not present

## 2022-09-10 MED ORDER — VENLAFAXINE HCL ER 37.5 MG PO CP24: 37.5000 mg | ORAL_CAPSULE | Freq: Every day | ORAL | 1 refills | Status: DC

## 2022-09-10 NOTE — Assessment & Plan Note (Signed)
Uncontrolled.  Suspect exacerbated by Wellbutrin. He had stopped Wellbutrin a week ago.  Start Effexor 37.5 mg daily.  Continue trazodone 50 mg nightly and advised to increase trazodone to 100 mg nightly in one weeks time if no improvement of sleep.  Close follow up

## 2022-09-10 NOTE — Patient Instructions (Addendum)
Lets keep doing trazodone '50mg'$  for 3-7 more days, if no improvement of sleep, increase to '100mg'$  ( this is two of the '50mg'$  tablets taken together) at bedtime.   For anxiety, we will start Effexor 37.5 mg.  This medication is effective for anxiety, depression and headache prevention.  Please stop or limit Excedrin Migraine as I am concerned this is creating a medication overuse headache.   Start magnesium citrate '400mg'$  daily.   Often as a daily preventative, I recommend taking magnesium citrate with riboflavin '400mg'$  daily and CoQ10 '100mg'$  three times daily. You may find these supplements over the counter.    Please let me know how you are doing.

## 2022-09-10 NOTE — Assessment & Plan Note (Signed)
Reassuring neurologic exam.  Headache is similar to headaches in the past . No alarm features at this time.  Advised to start magnesium citrate, Effexor 37.5 mg daily.  Close follow-up

## 2022-09-10 NOTE — Progress Notes (Signed)
Assessment & Plan:  Anxiety Assessment & Plan: Uncontrolled.  Suspect exacerbated by Wellbutrin. He had stopped Wellbutrin a week ago.  Start Effexor 37.5 mg daily.  Continue trazodone 50 mg nightly and advised to increase trazodone to 100 mg nightly in one weeks time if no improvement of sleep.  Close follow up  Orders: -     TSH; Future -     Venlafaxine HCl ER; Take 1 capsule (37.5 mg total) by mouth daily with breakfast.  Dispense: 90 capsule; Refill: 1  Chronic nonintractable headache, unspecified headache type Assessment & Plan: Reassuring neurologic exam.  Headache is similar to headaches in the past . No alarm features at this time.  Advised to start magnesium citrate, Effexor 37.5 mg daily.  Close follow-up  Orders: -     Venlafaxine HCl ER; Take 1 capsule (37.5 mg total) by mouth daily with breakfast.  Dispense: 90 capsule; Refill: 1     Return precautions given.   Risks, benefits, and alternatives of the medications and treatment plan prescribed today were discussed, and patient expressed understanding.   Education regarding symptom management and diagnosis given to patient on AVS either electronically or printed.  Return in about 2 weeks (around 09/24/2022) for Fasting labs in 2-3 weeks, Follow Up Chronic Management.  Mable Paris, FNP  Subjective:    Patient ID: Paul Silva, male    DOB: 04-14-1986, 36 y.o.   MRN: [id number]  Paul Silva is a 36 y.o. male who presents today for follow up.   HPI: Complains of bilateral temporal or posterior occipital HA x one year.  4-5 HAs per week.  Takes excredrin migraine daily which eases the pain. He goes to dark room and sleep helps HA.  Endorses photophobia, nausea.  No vision loss, vomiting, dizziness, vertigo.  No caffeine.  No known trigger for HA.  HA feels like a throb. Not worse HA of life. Had HA as kid which were similar.  No HA with sneezing, valsalva.  The headache doesn't awaken him from sleep.    Vision exam is UTD.   Complains of increased anxiety over parents, work stress. Denies depression, si, hi.  Trouble falling and staying asleep.  Stopped wellbutrin one week ago as felt more anxiety.         Allergies: Wellbutrin [bupropion] Current Outpatient Medications on File Prior to Visit  Medication Sig Dispense Refill   rosuvastatin (CRESTOR) 10 MG tablet Take 1 tablet (10 mg total) by mouth every evening. 90 tablet 3   traZODone (DESYREL) 50 MG tablet Take 0.5 tablets (25 mg total) by mouth at bedtime. 30 tablet 1   vitamin B-12 (CYANOCOBALAMIN) 1000 MCG tablet Take 1 tablet (1,000 mcg total) by mouth daily. 90 tablet 1   No current facility-administered medications on file prior to visit.    Review of Systems  Constitutional:  Negative for chills and fever.  Eyes:  Positive for photophobia. Negative for visual disturbance.  Respiratory:  Negative for cough.   Cardiovascular:  Negative for chest pain and palpitations.  Gastrointestinal:  Negative for nausea and vomiting.  Neurological:  Positive for headaches. Negative for dizziness.  Psychiatric/Behavioral:  Positive for sleep disturbance. Negative for suicidal ideas. The patient is nervous/anxious.       Objective:    BP 126/82   Pulse 93   Temp 98.4 F (36.9 C) (Oral)   Ht '5\' 11"'$  (1.803 m)   Wt 269 lb 6.4 oz (122.2 kg)   SpO2 97%  BMI 37.57 kg/m  BP Readings from Last 3 Encounters:  09/10/22 126/82  07/18/22 126/70  04/27/22 (!) 117/94   Wt Readings from Last 3 Encounters:  09/10/22 269 lb 6.4 oz (122.2 kg)  07/18/22 263 lb 6.4 oz (119.5 kg)  04/27/22 257 lb 8 oz (116.8 kg)    Physical Exam Vitals reviewed.  Constitutional:      Appearance: He is well-developed.  HENT:     Right Ear: Hearing normal.     Left Ear: Hearing normal.     Mouth/Throat:     Pharynx: Uvula midline. No posterior oropharyngeal erythema.  Eyes:     General: Lids are normal. Lids are everted, no foreign bodies  appreciated.     Conjunctiva/sclera: Conjunctivae normal.     Pupils: Pupils are equal, round, and reactive to light.     Comments: Normal fundus bilaterally.  Cardiovascular:     Rate and Rhythm: Regular rhythm.     Heart sounds: Normal heart sounds.  Pulmonary:     Effort: Pulmonary effort is normal. No respiratory distress.     Breath sounds: Normal breath sounds. No wheezing, rhonchi or rales.  Lymphadenopathy:     Head:     Right side of head: No submental, submandibular, tonsillar, preauricular, posterior auricular or occipital adenopathy.     Left side of head: No submental, submandibular, tonsillar, preauricular, posterior auricular or occipital adenopathy.     Cervical: No cervical adenopathy.  Skin:    General: Skin is warm and dry.  Neurological:     Mental Status: He is alert.     Cranial Nerves: No cranial nerve deficit.     Sensory: No sensory deficit.     Deep Tendon Reflexes:     Reflex Scores:      Bicep reflexes are 2+ on the right side and 2+ on the left side.      Patellar reflexes are 2+ on the right side and 2+ on the left side.    Comments: Grip equal and strong bilateral upper extremities. Gait strong and steady. Able to perform rapid alternating movement without difficulty.  Psychiatric:        Speech: Speech normal.        Behavior: Behavior normal.

## 2022-09-11 ENCOUNTER — Other Ambulatory Visit (INDEPENDENT_AMBULATORY_CARE_PROVIDER_SITE_OTHER): Payer: No Typology Code available for payment source

## 2022-09-11 DIAGNOSIS — F419 Anxiety disorder, unspecified: Secondary | ICD-10-CM | POA: Diagnosis not present

## 2022-09-11 DIAGNOSIS — E785 Hyperlipidemia, unspecified: Secondary | ICD-10-CM

## 2022-09-11 LAB — COMPREHENSIVE METABOLIC PANEL WITH GFR
ALT: 33 U/L (ref 0–53)
AST: 18 U/L (ref 0–37)
Albumin: 4.7 g/dL (ref 3.5–5.2)
Alkaline Phosphatase: 70 U/L (ref 39–117)
BUN: 10 mg/dL (ref 6–23)
CO2: 29 meq/L (ref 19–32)
Calcium: 9.8 mg/dL (ref 8.4–10.5)
Chloride: 101 meq/L (ref 96–112)
Creatinine, Ser: 1.18 mg/dL (ref 0.40–1.50)
GFR: 80.3 mL/min
Glucose, Bld: 103 mg/dL — ABNORMAL HIGH (ref 70–99)
Potassium: 4.2 meq/L (ref 3.5–5.1)
Sodium: 136 meq/L (ref 135–145)
Total Bilirubin: 0.7 mg/dL (ref 0.2–1.2)
Total Protein: 7.1 g/dL (ref 6.0–8.3)

## 2022-09-11 LAB — LIPID PANEL
Cholesterol: 169 mg/dL (ref 0–200)
HDL: 44.9 mg/dL
LDL Cholesterol: 111 mg/dL — ABNORMAL HIGH (ref 0–99)
NonHDL: 124.39
Total CHOL/HDL Ratio: 4
Triglycerides: 67 mg/dL (ref 0.0–149.0)
VLDL: 13.4 mg/dL (ref 0.0–40.0)

## 2022-09-11 LAB — TSH: TSH: 1.34 u[IU]/mL (ref 0.35–5.50)

## 2022-09-20 ENCOUNTER — Telehealth: Payer: Self-pay | Admitting: Family

## 2022-09-20 NOTE — Telephone Encounter (Signed)
Requesting a cardiology referral due to family history of heart failure.

## 2022-09-21 ENCOUNTER — Encounter: Payer: Self-pay | Admitting: Family

## 2022-09-21 ENCOUNTER — Other Ambulatory Visit: Payer: Self-pay

## 2022-09-21 DIAGNOSIS — Z8249 Family history of ischemic heart disease and other diseases of the circulatory system: Secondary | ICD-10-CM

## 2022-09-21 NOTE — Telephone Encounter (Signed)
Referral placed.

## 2022-10-09 DIAGNOSIS — Z8249 Family history of ischemic heart disease and other diseases of the circulatory system: Secondary | ICD-10-CM | POA: Insufficient documentation

## 2022-10-09 NOTE — Progress Notes (Signed)
Primary Care Provider: Burnard Hawthorne, [place] Cardiologist: Glenetta Hew, MD Electrophysiologist: None  Clinic Note: Chief Complaint  Patient presents with   New Patient (Initial Visit)    Episodes of chest pain; family history of CAD   ===================================  ASSESSMENT/PLAN   Problem List Items Addressed This Visit       Cardiology Problems   Hyperlipidemia (Chronic)    LDL 111 on recent check.  With family history of CAD, would want to target LDL less than 100.  For further restratification, could consider coronary calcium score versus coronary CTA depending on results of GXT.  Currently recently started on atorvastatin 10 mg.  Low threshold to titrate up further depending follow-up results of labs.  If GXT does not show any abnormalities, would consider coronary calcium score for risk ratification to determine if more aggressive goal less than 100 LDL is necessary.      Relevant Orders   Lipid panel     Other   Blood pressure elevated without history of HTN    He says his blood pressure is usually better than this at home.  He is a little stressed today.  Will monitor and follow-up and address accordingly.      Relevant Orders   EXERCISE TOLERANCE TEST (ETT)   Atypical chest pain - Primary    Chest pain does not necessarily exertional.  Somewhat atypical in nature and that is random, happening symptoms are rare and sometimes at exertion.  I would like to get a baseline risk assessment on him, but with him having symptoms we will start off with a stress test.  Recommendation is GXT (Graduated Treadmill Exercise Tolerance Test) -> Pending results, if negative, 1 probably then consider coronary calcium score for baseline cardiovascular risk assessment, however if somewhat abnormal, would then consider either coronary CTA or even potentially cardiac catheterization.   Shared Decision Making/Informed Consent{ The risks [chest pain,  shortness of breath, cardiac arrhythmias, dizziness, blood pressure fluctuations, myocardial infarction, stroke/transient ischemic attack, and life-threatening complications (estimated to be 1 in 10,000)], benefits (risk stratification, diagnosing coronary artery disease, treatment guidance) and alternatives of an exercise tolerance test were discussed in detail with [name] and he agrees to proceed.       Relevant Orders   EKG 12-Lead   EXERCISE TOLERANCE TEST (ETT)   Hepatic function panel   Lipid panel   Cardiac Stress Test: Informed Consent Details: Physician/Practitioner Attestation; Transcribe to consent form and obtain patient signature   Family history of premature CAD (Chronic)    His father had an MI at 56 justifiably is concerned.  Since he is having symptoms we will evaluate for ischemic EKG changes or symptoms with GXT, if negative, then we will proceed with Coronary Calcium Score, if abnormal, would then proceed with more definitive evaluation with either coronary CTA or cardiac catheterization.  Agree with starting atorvastatin.  For now we will hold off on initiating aspirin or beta-blocker pending initial testing.      Relevant Orders   EKG 12-Lead   EXERCISE TOLERANCE TEST (ETT)   Hepatic function panel   Lipid panel   Cardiac Stress Test: Informed Consent Details: Physician/Practitioner Attestation; Transcribe to consent form and obtain patient signature   ===================================  HPI:    Paul Silva is a borderline morbidly obese 37 y.o. male with GAD, chronic headaches and hyperlipidemia who is being seen today for the evaluation of Family History of CAD at the request of Vidal Schwalbe,  Yvetta Coder, FNP.  Paul Silva was last seen on September 10, 2022 with temporal/posterior occipital headache for a week.  Noted for 5 headaches per week.  No vision changes nausea vomiting or vertigo.  No known trigger.  Noted increased anxiety about his parents his work  stress but no depression.  No insomnia.  Neurologically intact.  Continued trazodone for sleep aid along with Effexor.  [facility] history -> Patient called and on December 28 requesting cardiology referral to discuss family history of premature CAD (father had an MI at age 94).  Recent Hospitalizations: none  Reviewed  CV studies:    The following studies were reviewed today: (if available, images/films reviewed: From Epic Chart or Care Everywhere) None:  Interval History:   Paul Silva presents to discuss his family history of CAD.  He said the last time he went to see his PCP they discussed his family history but he initially did not want follow-up with consultation, then he went home and had a few episodes of chest pain.  He had an episode that occurred 1 night after eating tacos where he had a prolonged episode of chest pain so he went in to get checked out.  Otherwise he is also had episodes of occasional chest discomfort/tightness that is oftentimes he is lying down in bed, or just simply sitting there resting.  It can however occasionally happen when he is active doing things.  Does not actually get worse with activity.  He says his episodes last for 5 to 10 minutes he feels like he is has a tightness in his chest and [place], pounding heartbeat sensation that tends to pick him up.  These episodes are relatively random and can happen every couple weeks or a couple times in a week.to see grow up.  He says he gets short of breath often when he has allergies injecting him up.  He can also get short of breath because of the shape, and has less exercise tolerance.  However he recently started go back to the gym a few weeks ago and has been able to do exercise.  He says he exercised about half yesterday with no chest pain or dyspnea.  He says that after he has had a couple these episodes of chest discomfort most notably the 1 after eating tacos, he started picking at his father and realized that  he has 2 children that he wants to be around to see grow up.   CV Review of Symptoms (Summary): positive for - chest pain, irregular heartbeat, and some shortness of breath related to congestion from allergies. negative for - edema, orthopnea, paroxysmal nocturnal dyspnea, shortness of breath, or syncope or near syncope, TIA/amaurosis fugax or claudication  REVIEWED OF SYSTEMS   Review of Systems  Constitutional:  Positive for malaise/fatigue (Some exercise intolerance.). Negative for weight loss.  HENT:  Positive for congestion.   Respiratory:  Positive for cough (With allergies but not recently). Negative for sputum production and shortness of breath.   Cardiovascular:        Per HPI  Gastrointestinal:  Positive for heartburn. Negative for blood in stool and melena.  Genitourinary:  Negative for hematuria.  Musculoskeletal:  Negative for joint pain and myalgias.  Neurological:  Positive for dizziness (If he stands up too quickly) and headaches (Intermittent stress related to migraine headaches/tension headaches they usually start off with feeling his ears pop). Negative for focal weakness.  Psychiatric/Behavioral:  Negative for depression and memory loss. The patient  is not nervous/anxious and does not have insomnia.     I have reviewed and (if needed) personally updated the patient's problem list, medications, allergies, past medical and surgical history, social and family history.   PAST MEDICAL HISTORY   Past Medical History:  Diagnosis Date   Abnormal WBC count    Allergy    Anxiety    Asthma    Blood pressure elevated without history of HTN    Fracture of thumb    Frequent headaches    Hyperlipidemia    Hyperpigmentation    Insomnia    Interstitial pulmonary disease (HCC)    Leukopenia    Migraines    Personal history of COVID-19 02/05/2021   Vitamin D deficiency     PAST SURGICAL HISTORY   Past Surgical History:  Procedure Laterality Date   INSERTION OF MESH   04/27/2022   Procedure: INSERTION OF MESH;  Surgeon: Herbert Pun, MD;  Location: [facility] ORS;  Service: General;;   none     XI ROBOTIC ASSISTED VENTRAL HERNIA N/A 04/27/2022   Procedure: XI ROBOTIC ASSISTED VENTRAL HERNIA;  Surgeon: Herbert Pun, MD;  Location: [facility] ORS;  Service: General;  Laterality: N/A;    Immunization History  Administered Date(s) Administered   Tdap 01/15/2018, 08/03/2019    MEDICATIONS/ALLERGIES   No outpatient medications have been marked as taking for the 10/10/22 encounter (Office Visit) with Leonie Man, MD.    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  Constitutional:      General: He is not in acute distress.    Appearance: Normal appearance. He is obese. He is not ill-appearing (Well-groomed.   Healthy-appearing.) or toxic-appearing.  HENT:     Head: Normocephalic and atraumatic.  Neck:     Vascular: No carotid bruit.  Cardiovascular:     Rate and Rhythm: Normal rate and regular rhythm.     Pulses: Normal pulses.     Heart sounds: Normal heart sounds. No murmur heard.  No friction rub. No gallop.  Pulmonary:     Effort: Pulmonary effort is normal. No respiratory distress.     Breath sounds: No wheezing, rhonchi or rales.  Chest:     Chest wall: No tenderness.  Abdominal:     General: Bowel sounds are normal. There is no distension.     Palpations: Abdomen is soft. There is no mass (No HSM or bruit).     Tenderness: There is no abdominal tenderness. There is no guarding or rebound.     Hernia: No hernia is present.  Musculoskeletal:        General: No swelling. Normal range of motion.     Cervical back: Normal range of motion and neck supple.  Skin:    General: Skin is warm and dry.  Neurological:     General: No focal deficit present.     Mental Status: He is alert and oriented to person, place, and time.     Gait: Gait normal.  Psychiatric:        Mood and Affect: Mood normal.        Behavior: Behavior normal.        Thought Content: Thought content normal.        Judgment: Judgment normal.     Adult ECG Report  Rate: 82 ;  Rhythm: normal sinus rhythm and Non-specific ST-T wave changes ;   Narrative Interpretation: borderline  Recent Labs: Reviewed Lab Results  Component Value Date   CHOL 169 09/11/2022   HDL 44.90 09/11/2022   LDLCALC 111 (H) 09/11/2022   TRIG 67.0 09/11/2022   CHOLHDL 4 09/11/2022   Lab Results  Component Value Date   CREATININE 1.18 09/11/2022   BUN 10 09/11/2022   NA 136 09/11/2022   K 4.2 09/11/2022   CL 101 09/11/2022   CO2 29 09/11/2022      Latest Ref Rng & Units 09/06/2021   10:33 AM 08/30/2021    9:23 AM 06/21/2020    9:10 AM  CBC  WBC 4.0 - 10.5 K/uL 2.7  2.5    Hemoglobin 13.0 - 17.0 g/dL 14.1  13.5     Hematocrit 39.0 - 52.0 % 43.4  41.2  44.8   Platelets 150 - 400 K/uL 168  153.0      Lab Results  Component Value Date   HGBA1C 5.9 (A) 07/18/2022   Lab Results  Component Value Date   TSH 1.34 09/11/2022    ================================================== I spent a total of 24 minutes with the patient spent in direct patient consultation.  Additional time spent with chart review  / charting (studies, outside notes, etc): 10 min precharting; min charting; 13-23 minutes total Total Time: 47 min  Current medicines are reviewed at length with the patient today.  (+/- concerns) n/a  Notice: This dictation was prepared with Dragon dictation along with smart phrase technology. Any transcriptional errors that result from this process are unintentional and may not be corrected upon review.   Studies Ordered:  Orders Placed This Encounter  Procedures   Hepatic function panel   Lipid panel   Cardiac Stress Test: Informed Consent Details: Physician/Practitioner Attestation; Transcribe to consent form and obtain patient signature   EXERCISE TOLERANCE TEST (ETT)   EKG 12-Lead   No orders of the defined types were placed in this encounter.   Patient Instructions / Medication Changes & Studies & Tests Ordered   Patient Instructions  Medication Instructions:   No changes in medications  Lab Work: in [place] ( fasting)  in April  2024 Lipid Hepatic panel If you have labs (blood work) drawn today and your tests are completely normal, you will receive your results only by: [place] (if you have MyChart) OR A paper copy in the mail If you have any lab test that is abnormal or we need to change your treatment, we will call you to review the results.   Testing/Procedures:   Will be schedule in [facility] office Your physician has requested that you have an exercise tolerance test.    Please also follow instruction, as given.   Do not drink or eat  foods with caffeine for 24 hours before the test. (Chocolate, coffee, tea, or energy drinks) If you use an inhaler, bring it with you to the test. Do not smoke for 4 hours before the test. Wear comfortable shoes and clothing.      Follow-Up: At [facility], you and your health needs are our priority.  As part of our continuing mission to provide you with exceptional heart care, we have created designated Provider Care Teams.  These Care Teams include your primary Cardiologist (physician) and Advanced Practice Providers (APPs -  Physician Assistants and Nurse Practitioners) who all work together to provide you with the care you need, when you need it.     Your next appointment:   4 month(s)  The format for your next appointment:   In Person  Provider:   Glenetta Hew, MD   Other Instructions   If Your Exercise tolerance test is abnormal Dr Ellyn Hack recommends you have Coronary CT angiogram  Here is information: Your physician has requested that you have coronary  CTA. Coronary computed tomography (CT)angiogram  is a special type of CT scan that uses a computer to produce multi-dimensional views of major blood vessels throughout the heart.  CT angiography, a contrast material is injected through an IV to help visualize the blood vessels  a painless test that uses an x-ray machine to take clear, detailed pictures of your heart arteries .  Please follow instruction sheet as given.     If your exercise tolerance test is normal  Dr Ellyn Hack recommend you have a Calcium Scoring test Here is some information :   This is $99 out of [employer]Point.   Coronary CalciumScan A coronary calcium scan is an imaging test used to look for deposits of calcium and other fatty materials (plaques) in the inner lining of the blood vessels of the heart (coronary arteries). These deposits of calcium and plaques can partly clog and narrow the coronary arteries without producing any symptoms or warning signs.  This puts a person at risk for a heart attack. This test can detect these deposits before symptoms develop. Tell a health care provider about: Any allergies you have. All medicines you are taking, including vitamins, herbs, eye drops, creams, and over-the-counter medicines. Any problems you or family members have had with anesthetic medicines. Any blood disorders you have. Any surgeries you have had. Any medical conditions you have. Whether you are pregnant or may be pregnant. What are the risks? Generally, this is a safe procedure. However, problems may occur, including: Harm to a pregnant woman and her unborn baby. This test involves the use of radiation. Radiation exposure can be dangerous to a pregnant woman and her unborn baby. If you are pregnant, you generally should not have this procedure done. Slight increase in the risk of cancer. This is because  of the radiation involved in the test. What happens before the procedure? No preparation is needed for this procedure. What happens during the procedure? You will undress and remove any jewelry around your neck or chest. You will put on a hospital gown. Sticky electrodes will be placed on your chest. The electrodes will be connected to an electrocardiogram (ECG) machine to record a tracing of the electrical activity of your heart. A CT scanner will take pictures of your heart. During this time, you will be asked to lie still and hold your breath for 2-3 seconds while a picture of your heart is being taken. The procedure may vary among health care providers and hospitals. What happens after the procedure? You can get dressed. You can return to your normal activities. It is up to you to get the results of your test. Ask your health care provider, or the department that is doing the test, when your results will be ready. Summary A coronary calcium scan is an imaging test used to look for deposits of calcium and other fatty materials  (plaques) in the inner lining of the blood vessels of the heart (coronary arteries). Generally, this is a safe procedure. Tell your health care provider if you are pregnant or may be pregnant. No preparation is needed for this procedure. A CT scanner will take pictures of your heart. You can return to your normal activities after the scan is done. This information is not intended to replace advice given to you by your health care provider. Make sure you discuss any questions you have with your health care provider. Document Released: 03/08/2008 Document Revised: 07/30/2016 Document Reviewed: 07/30/2016 Elsevier Interactive Patient Education  2017 Elsevier Inc.     Leonie Man, MD, MS Glenetta Hew, M.D., M.S. Interventional Cardiologist  [place]  Pager # [phone] Phone # [phone] [address] [place], [place] [address]   Thank you for choosing [facility]!!

## 2022-10-10 ENCOUNTER — Encounter: Payer: Self-pay | Admitting: Cardiology

## 2022-10-10 ENCOUNTER — Ambulatory Visit: Payer: No Typology Code available for payment source | Attending: Cardiology | Admitting: Cardiology

## 2022-10-10 VITALS — BP 138/90 | HR 82 | Ht 71.0 in | Wt 263.2 lb

## 2022-10-10 DIAGNOSIS — Z8249 Family history of ischemic heart disease and other diseases of the circulatory system: Secondary | ICD-10-CM | POA: Diagnosis not present

## 2022-10-10 DIAGNOSIS — J849 Interstitial pulmonary disease, unspecified: Secondary | ICD-10-CM

## 2022-10-10 DIAGNOSIS — E785 Hyperlipidemia, unspecified: Secondary | ICD-10-CM | POA: Diagnosis not present

## 2022-10-10 DIAGNOSIS — R030 Elevated blood-pressure reading, without diagnosis of hypertension: Secondary | ICD-10-CM

## 2022-10-10 DIAGNOSIS — R0789 Other chest pain: Secondary | ICD-10-CM | POA: Diagnosis not present

## 2022-10-10 NOTE — Patient Instructions (Signed)
Medication Instructions:   No changes in medications      Lab Work: in [place] ( fasting)  in April  2024 Lipid Hepatic panel If you have labs (blood work) drawn today and your tests are completely normal, you will receive your results only by: [place] (if you have MyChart) OR A paper copy in the mail If you have any lab test that is abnormal or we need to change your treatment, we will call you to review the results.   Testing/Procedures:   Will be schedule in [facility] office Your physician has requested that you have an exercise tolerance test.    Please also follow instruction, as given.   Do not drink or eat foods with caffeine for 24 hours before the test. (Chocolate, coffee, tea, or energy drinks) If you use an inhaler, bring it with you to the test. Do not smoke for 4 hours before the test. Wear comfortable shoes and clothing.      Follow-Up: At [facility], you and your health needs are our priority.  As part of our continuing mission to provide you with exceptional heart care, we have created designated Provider Care Teams.  These Care Teams include your primary Cardiologist (physician) and Advanced Practice Providers (APPs -  Physician Assistants and Nurse Practitioners) who all work together to provide you with the care you need, when you need it.     Your next appointment:   4 month(s)  The format for your next appointment:   In Person  Provider:   Glenetta Hew, MD   Other Instructions   If Your Exercise tolerance test is abnormal Dr Ellyn Hack recommends you have Coronary CT angiogram  Here is information: Your physician has requested that you have coronary  CTA. Coronary computed tomography (CT)angiogram  is a special type of CT scan that uses a computer to produce multi-dimensional views of major blood vessels throughout the heart.  CT angiography, a contrast material is injected through an IV to help visualize the blood vessels   a painless test that uses an x-ray machine to take clear, detailed pictures of your heart arteries .  Please follow instruction sheet as given.     If your exercise tolerance test is normal  Dr Ellyn Hack recommend you have a Calcium Scoring test Here is some information :   This is $99 out of [employer].   Coronary CalciumScan A coronary calcium scan is an imaging test used to look for deposits of calcium and other fatty materials (plaques) in the inner lining of the blood vessels of the heart (coronary arteries). These deposits of calcium and plaques can partly clog and narrow the coronary arteries without producing any symptoms or warning signs. This puts a person at risk for a heart attack. This test can detect these deposits before symptoms develop. Tell a health care provider about: Any allergies you have. All medicines you are taking, including vitamins, herbs, eye drops, creams, and over-the-counter medicines. Any problems you or family members have had with anesthetic medicines. Any blood disorders you have. Any surgeries you have had. Any medical conditions you have. Whether you are pregnant or may be pregnant. What are the risks? Generally, this is a safe procedure. However, problems may occur, including: Harm to a pregnant woman and her unborn baby. This test involves the use of radiation. Radiation exposure can be dangerous to a pregnant woman and her unborn baby. If you are pregnant, you generally should not have this  procedure done. Slight increase in the risk of cancer. This is because of the radiation involved in the test. What happens before the procedure? No preparation is needed for this procedure. What happens during the procedure? You will undress and remove any jewelry around your neck or chest. You will put on a hospital gown. Sticky electrodes will be placed on your chest. The electrodes will be connected to an electrocardiogram (ECG) machine to record a tracing of  the electrical activity of your heart. A CT scanner will take pictures of your heart. During this time, you will be asked to lie still and hold your breath for 2-3 seconds while a picture of your heart is being taken. The procedure may vary among health care providers and hospitals. What happens after the procedure? You can get dressed. You can return to your normal activities. It is up to you to get the results of your test. Ask your health care provider, or the department that is doing the test, when your results will be ready. Summary A coronary calcium scan is an imaging test used to look for deposits of calcium and other fatty materials (plaques) in the inner lining of the blood vessels of the heart (coronary arteries). Generally, this is a safe procedure. Tell your health care provider if you are pregnant or may be pregnant. No preparation is needed for this procedure. A CT scanner will take pictures of your heart. You can return to your normal activities after the scan is done. This information is not intended to replace advice given to you by your health care provider. Make sure you discuss any questions you have with your health care provider. Document Released: 03/08/2008 Document Revised: 07/30/2016 Document Reviewed: 07/30/2016 Elsevier Interactive Patient Education  2017 [employer].

## 2022-10-11 ENCOUNTER — Encounter: Payer: Self-pay | Admitting: Cardiology

## 2022-10-11 NOTE — Assessment & Plan Note (Signed)
His father had an MI at 94 justifiably is concerned.  Since he is having symptoms we will evaluate for ischemic EKG changes or symptoms with GXT, if negative, then we will proceed with Coronary Calcium Score, if abnormal, would then proceed with more definitive evaluation with either coronary CTA or cardiac catheterization.  Agree with starting atorvastatin.  For now we will hold off on initiating aspirin or beta-blocker pending initial testing.

## 2022-10-11 NOTE — Assessment & Plan Note (Signed)
Chest pain does not necessarily exertional.  Somewhat atypical in nature and that is random, happening symptoms are rare and sometimes at exertion.  I would like to get a baseline risk assessment on him, but with him having symptoms we will start off with a stress test.  Recommendation is GXT (Graduated Treadmill Exercise Tolerance Test) -> Pending results, if negative, 1 probably then consider coronary calcium score for baseline cardiovascular risk assessment, however if somewhat abnormal, would then consider either coronary CTA or even potentially cardiac catheterization.   Shared Decision Making/Informed Consent{ The risks [chest pain, shortness of breath, cardiac arrhythmias, dizziness, blood pressure fluctuations, myocardial infarction, stroke/transient ischemic attack, and life-threatening complications (estimated to be 1 in 10,000)], benefits (risk stratification, diagnosing coronary artery disease, treatment guidance) and alternatives of an exercise tolerance test were discussed in detail with Mr. Chap and he agrees to proceed.

## 2022-10-11 NOTE — Assessment & Plan Note (Signed)
He says his blood pressure is usually better than this at home.  He is a little stressed today.  Will monitor and follow-up and address accordingly.

## 2022-10-11 NOTE — Assessment & Plan Note (Signed)
LDL 111 on recent check.  With family history of CAD, would want to target LDL less than 100.  For further restratification, could consider coronary calcium score versus coronary CTA depending on results of GXT.  Currently recently started on atorvastatin 10 mg.  Low threshold to titrate up further depending follow-up results of labs.  If GXT does not show any abnormalities, would consider coronary calcium score for risk ratification to determine if more aggressive goal less than 100 LDL is necessary.

## 2022-10-30 ENCOUNTER — Ambulatory Visit: Payer: No Typology Code available for payment source | Attending: Cardiology

## 2022-10-30 DIAGNOSIS — R030 Elevated blood-pressure reading, without diagnosis of hypertension: Secondary | ICD-10-CM

## 2022-10-30 DIAGNOSIS — Z8249 Family history of ischemic heart disease and other diseases of the circulatory system: Secondary | ICD-10-CM | POA: Diagnosis not present

## 2022-10-30 DIAGNOSIS — R0789 Other chest pain: Secondary | ICD-10-CM

## 2022-10-30 LAB — EXERCISE TOLERANCE TEST
Angina Index: 0
Duke Treadmill Score: 10
Estimated workload: 11.7
Exercise duration (min): 10 min
Exercise duration (sec): 0 s
MPHR: 185 {beats}/min
Peak HR: 173 {beats}/min
Percent HR: 93 %
RPE: 15
Rest HR: 72 {beats}/min
ST Depression (mm): 0 mm

## 2022-11-18 ENCOUNTER — Encounter: Payer: Self-pay | Admitting: Family

## 2022-11-19 NOTE — Telephone Encounter (Signed)
Patient called about MyChart message. [place] office tried to schedule an appointment for him to come in to discuss getting ADHD medication. Patient did not want to wait until next week to come into office. He said he was going to try and schedule with a specialist .

## 2022-11-19 NOTE — Telephone Encounter (Signed)
Spoke to pt and he stated that he would like to know if you can prescribe ADHD  medication or send referral for him so he can get something ASAP to help with migraine  HA that are recurring

## 2022-11-26 NOTE — Telephone Encounter (Signed)
Pt returned Gae Bon call. Unable to transfer. Note below was read to him. As per pt, he said he will call back to scheduled an appt w/ provider. Still debating on where to go for his matter.

## 2022-11-26 NOTE — Telephone Encounter (Signed)
Called and lvm for patient to call back and schedule a visit with PCP.  Hart Haas,cma

## 2022-12-10 ENCOUNTER — Ambulatory Visit: Payer: No Typology Code available for payment source | Admitting: Family

## 2022-12-14 ENCOUNTER — Ambulatory Visit: Payer: No Typology Code available for payment source | Admitting: Family

## 2022-12-24 ENCOUNTER — Encounter: Payer: Self-pay | Admitting: Family

## 2022-12-24 ENCOUNTER — Ambulatory Visit: Payer: No Typology Code available for payment source | Admitting: Family

## 2022-12-24 VITALS — BP 128/80 | HR 89 | Temp 98.7°F | Ht 71.0 in | Wt 270.2 lb

## 2022-12-24 DIAGNOSIS — R519 Headache, unspecified: Secondary | ICD-10-CM

## 2022-12-24 DIAGNOSIS — R41840 Attention and concentration deficit: Secondary | ICD-10-CM | POA: Diagnosis not present

## 2022-12-24 DIAGNOSIS — F419 Anxiety disorder, unspecified: Secondary | ICD-10-CM

## 2022-12-24 DIAGNOSIS — G8929 Other chronic pain: Secondary | ICD-10-CM | POA: Diagnosis not present

## 2022-12-24 MED ORDER — BUPROPION HCL ER (XL) 300 MG PO TB24: 300.0000 mg | ORAL_TABLET | Freq: Every morning | ORAL | 2 refills | Status: DC

## 2022-12-24 MED ORDER — NAPROXEN SODIUM 550 MG PO TABS: ORAL_TABLET | ORAL | 1 refills | Status: DC

## 2022-12-24 MED ORDER — ONDANSETRON HCL 4 MG PO TABS: 4.0000 mg | ORAL_TABLET | Freq: Three times a day (TID) | ORAL | 0 refills | Status: DC | PRN

## 2022-12-24 MED ORDER — PROPRANOLOL HCL 10 MG PO TABS: 10.0000 mg | ORAL_TABLET | Freq: Two times a day (BID) | ORAL | 3 refills | Status: DC

## 2022-12-24 NOTE — Progress Notes (Signed)
Assessment & Plan:  Chronic nonintractable headache, unspecified headache type Assessment & Plan: Uncontrolled. Trial of propranolol 10mg  bid. Discussed if cervicogenic HA.  He declines cervical xray at this time.  Provided him with Naprosyn and Zofran to use for abortive therapy.  Orders: -     Ondansetron HCl; Take 1 tablet (4 mg total) by mouth every 8 (eight) hours as needed for nausea or vomiting.  Dispense: 20 tablet; Refill: 0 -     Naproxen Sodium; Take at earliest onset of headache. May repeat dose in 12 hours if needed. Do not exceed two tablets in 24 hours. Take with food  Dispense: 30 tablet; Refill: 1 -     Propranolol HCl; Take 1 tablet (10 mg total) by mouth 2 (two) times daily.  Dispense: 180 tablet; Refill: 3  Difficulty concentrating -     Ambulatory referral to Psychiatry  Anxiety Assessment & Plan: Headaches not worsened by Wellbutrin.  Patient resumed Wellbutrin 300 mg on his own.  He is no longer on Effexor.    Start propranolol 10 mg twice daily for HA, anxiety.   Orders: -     buPROPion HCl ER (XL); Take 1 tablet (300 mg total) by mouth every morning.  Dispense: 90 tablet; Refill: 2     Return precautions given.   Risks, benefits, and alternatives of the medications and treatment plan prescribed today were discussed, and patient expressed understanding.   Education regarding symptom management and diagnosis given to patient on AVS either electronically or printed.  Return in about 3 weeks (around 01/14/2023).  Mable Paris, FNP  Subjective:    Patient ID: Paul Silva, male    DOB: Jan 15, 1986, 37 y.o.   MRN: [id number]  CC: Paul Silva is a 37 y.o. male who presents today for follow up.   HPI: He stopped effexor and resumed the wellbutrin 300mg  as he felt better for anxiety.  HA are not increased.   He is using trazodone 25mg  qhs with relief.   HA start in posterior neck and then move to bilateral temporal area. Describes a dull pain. HA  similar to HA in the past.  A/w nausea. No associated vision loss, numbness down arms.  HA resolve with laying down and dark room.  He takes naprosyn with relief. 3 HA per week.   He thinks anxiety contributes to ha.  4 hours of sleep per night.  Eye exam is up to date; he wears glasses.   He has an appointment with psychiatrist 01/07/23   Consult cardiology 10/10/2022, Dr Ellyn Hack; follow up scheduled for May 2024 LDL 111 Exercise stress test 10/30/22 normal   Allergies: Wellbutrin [bupropion] Current Outpatient Medications on File Prior to Visit  Medication Sig Dispense Refill   rosuvastatin (CRESTOR) 10 MG tablet Take 1 tablet (10 mg total) by mouth every evening. 90 tablet 3   traZODone (DESYREL) 50 MG tablet Take 0.5 tablets (25 mg total) by mouth at bedtime. 30 tablet 1   vitamin B-12 (CYANOCOBALAMIN) 1000 MCG tablet Take 1 tablet (1,000 mcg total) by mouth daily. 90 tablet 1   No current facility-administered medications on file prior to visit.    Review of Systems  Constitutional:  Negative for chills and fever.  Respiratory:  Negative for cough.   Cardiovascular:  Negative for chest pain and palpitations.  Gastrointestinal:  Negative for nausea and vomiting.  Musculoskeletal:  Negative for neck pain.  Neurological:  Positive for headaches. Negative for dizziness and numbness.  Psychiatric/Behavioral:  The patient is nervous/anxious.       Objective:    BP 128/80   Pulse 89   Temp 98.7 F (37.1 C) (Oral)   Ht 5\' 11"  (1.803 m)   Wt 270 lb 3.2 oz (122.6 kg)   SpO2 98%   BMI 37.69 kg/m  BP Readings from Last 3 Encounters:  12/24/22 128/80  10/10/22 (!) 138/90  09/10/22 126/82   Wt Readings from Last 3 Encounters:  12/24/22 270 lb 3.2 oz (122.6 kg)  10/10/22 263 lb 3.2 oz (119.4 kg)  09/10/22 269 lb 6.4 oz (122.2 kg)    Physical Exam Vitals reviewed.  Constitutional:      Appearance: He is well-developed.  Cardiovascular:     Rate and Rhythm: Regular  rhythm.     Heart sounds: Normal heart sounds.  Pulmonary:     Effort: Pulmonary effort is normal. No respiratory distress.     Breath sounds: Normal breath sounds. No wheezing, rhonchi or rales.  Musculoskeletal:     Cervical back: No torticollis. No pain with movement, spinous process tenderness or muscular tenderness.  Skin:    General: Skin is warm and dry.  Neurological:     Mental Status: He is alert.  Psychiatric:        Speech: Speech normal.        Behavior: Behavior normal.

## 2022-12-24 NOTE — Patient Instructions (Signed)
Provided you with Naprosyn which is a potent anti-inflammatory to be used with Zofran in combination for rescue therapy when you experience a headache. Please start propranolol 10 mg twice daily.  We may need to titrate this medication.  This medication is used for anxiety and headache. Please let me know how you are doing

## 2022-12-24 NOTE — Assessment & Plan Note (Signed)
Uncontrolled. Trial of propranolol 10mg  bid. Discussed if cervicogenic HA.  He declines cervical xray at this time.  Provided him with Naprosyn and Zofran to use for abortive therapy.

## 2022-12-24 NOTE — Assessment & Plan Note (Signed)
Headaches not worsened by Wellbutrin.  Patient resumed Wellbutrin 300 mg on his own.  He is no longer on Effexor.    Start propranolol 10 mg twice daily for HA, anxiety.

## 2023-01-14 ENCOUNTER — Telehealth: Payer: No Typology Code available for payment source | Admitting: Family

## 2023-01-17 ENCOUNTER — Encounter: Payer: Self-pay | Admitting: Family

## 2023-01-17 ENCOUNTER — Telehealth: Payer: No Typology Code available for payment source | Admitting: Family

## 2023-01-17 VITALS — Ht 71.0 in | Wt 270.2 lb

## 2023-01-17 DIAGNOSIS — G8929 Other chronic pain: Secondary | ICD-10-CM

## 2023-01-17 DIAGNOSIS — F419 Anxiety disorder, unspecified: Secondary | ICD-10-CM | POA: Diagnosis not present

## 2023-01-17 DIAGNOSIS — R519 Headache, unspecified: Secondary | ICD-10-CM

## 2023-01-17 NOTE — Progress Notes (Signed)
Virtual Visit via Video Note  I connected with Paul Silva on 01/19/23 at  4:00 PM EDT by a video enabled telemedicine application and verified that I am speaking with the correct person using two identifiers. Location patient: home Location provider: work  Persons participating in the virtual visit: patient, provider  I discussed the limitations of evaluation and management by telemedicine and the availability of in person appointments. The patient expressed understanding and agreed to proceed.  HPI: Follow up HA He has started propranolol 10mg  BID.  HA have largely resolved.  He can feel "sensation" headache however the pain is improved and is not the same as previous.  He saw mental health counselor and also a provider through police department whom started buspar 5mg  bid and increased wellbutrin to 450mg  .  He states he saw his psychiatrist.  He is not taking trazodone.   He seeing counselor once per week.   No h/o seizure, bulimia, anorexia, or heavy alcohol use.   ROS: See pertinent positives and negatives per HPI.  EXAM:  VITALS per patient if applicable: Ht 5\' 11"  (1.803 m)   Wt 270 lb 3.2 oz (122.6 kg)   BMI 37.69 kg/m  BP Readings from Last 3 Encounters:  12/24/22 128/80  10/10/22 (!) 138/90  09/10/22 126/82   Wt Readings from Last 3 Encounters:  01/17/23 270 lb 3.2 oz (122.6 kg)  12/24/22 270 lb 3.2 oz (122.6 kg)  10/10/22 263 lb 3.2 oz (119.4 kg)      12/24/2022    3:20 PM 09/10/2022    3:17 PM 01/10/2022    8:32 AM 08/30/2021    9:05 AM  GAD 7 : Generalized Anxiety Score  Nervous, Anxious, on Edge 3 3 0 2  Control/stop worrying 3 3 0 2  Worry too much - different things 3 3 0 2  Trouble relaxing 3 3 0 2  Restless 3 3 0 2  Easily annoyed or irritable 3 2 0 1  Afraid - awful might happen 3 3 0 0  Total GAD 7 Score 21 20 0 11  Anxiety Difficulty Somewhat difficult Very difficult Not difficult at all Somewhat difficult       12/24/2022    3:19 PM  09/10/2022    3:18 PM 09/10/2022    3:16 PM  Depression screen PHQ 2/9  Decreased Interest 0 0 0  Down, Depressed, Hopeless 0 0 0  PHQ - 2 Score 0 0 0  Altered sleeping 3 3 3   Tired, decreased energy 3 3 3   Change in appetite 0 3 3  Feeling bad or failure about yourself  0 0 0  Trouble concentrating 3 0 0  Moving slowly or fidgety/restless 3 0 0  Suicidal thoughts 0 0 0  PHQ-9 Score 12 9 9   Difficult doing work/chores Somewhat difficult Very difficult Very difficult    GENERAL: alert, oriented, appears well and in no acute distress  HEENT: atraumatic, conjunttiva clear, no obvious abnormalities on inspection of external nose and ears  NECK: normal movements of the head and neck  LUNGS: on inspection no signs of respiratory distress, breathing rate appears normal, no obvious gross SOB, gasping or wheezing  CV: no obvious cyanosis  MS: moves all visible extremities without noticeable abnormality  PSYCH/NEURO: pleasant and cooperative, no obvious depression or anxiety, speech and thought processing grossly intact  ASSESSMENT AND PLAN: Chronic nonintractable headache, unspecified headache type Assessment & Plan: Improved.  As patient still has "sensation" of headache ,  we jointly agreed, in the setting of increased anxiety, to increase propranolol 20 mg twice daily.   Anxiety Assessment & Plan: Suboptimal control.  Patient recently had appointment with health counselor and provider through the [employer].  Requesting these notes and have asked patient to send me contact information.  Interesting as provider increased Wellbutrin and my concern would be increased anxiety.  He has started BuSpar 5 mg twice daily.  We deferred further changes and advised patient to consider returning to Wellbutrin to 300 mg.  I would be more inclined to start patient on an SSRI in conjunction with a  more moderate dose of Wellbutrin.      -we discussed possible serious and likely  etiologies, options for evaluation and workup, limitations of telemedicine visit vs in person visit, treatment, treatment risks and precautions. Pt prefers to treat via telemedicine empirically rather then risking or undertaking an in person visit at this moment.    I discussed the assessment and treatment plan with the patient. The patient was provided an opportunity to ask questions and all were answered. The patient agreed with the plan and demonstrated an understanding of the instructions.   The patient was advised to call back or seek an in-person evaluation if the symptoms worsen or if the condition fails to improve as anticipated.  Advised if desired AVS can be mailed or viewed via MyChart if Mychart user.   Rennie Plowman, FNP

## 2023-01-17 NOTE — Patient Instructions (Addendum)
For now, you may continue wellbutrin  daily, buspar  twice daily You may increase propranolol to  twice daily.   Please send me contact information of psychiatry and counselor so we can requests records.   We discussed today starting medication called Wellbutrin.  As also discussed, you must limit alcohol on this medication as alcohol and Wellbutrin together may increase your risk for seizure.  You may drink no more than 1 alcoholic beverage on this medication.  A standard drink is 12 ounces of regular beer, which is usually about 5% alcohol OR 5 ounces of wine, which is typically about 12% alcohol OR   1.5 ounces of distilled spirits, which is about 40% alcohol

## 2023-01-19 NOTE — Assessment & Plan Note (Signed)
Improved.  As patient still has "sensation" of headache , we jointly agreed, in the setting of increased anxiety, to increase propranolol 20 mg twice daily.

## 2023-01-19 NOTE — Assessment & Plan Note (Signed)
Suboptimal control.  Patient recently had appointment with health counselor and provider through the [employer].  Requesting these notes and have asked patient to send me contact information.  Interesting as provider increased Wellbutrin and my concern would be increased anxiety.  He has started BuSpar 5 mg twice daily.  We deferred further changes and advised patient to consider returning to Wellbutrin to 300 mg.  I would be more inclined to start patient on an SSRI in conjunction with a  more moderate dose of Wellbutrin.

## 2023-01-22 ENCOUNTER — Encounter: Payer: Self-pay | Admitting: Family

## 2023-01-25 ENCOUNTER — Telehealth: Payer: Self-pay | Admitting: Family

## 2023-01-25 ENCOUNTER — Other Ambulatory Visit: Payer: Self-pay | Admitting: Family

## 2023-01-25 DIAGNOSIS — R519 Headache, unspecified: Secondary | ICD-10-CM

## 2023-01-25 MED ORDER — PROPRANOLOL HCL 10 MG PO TABS: 20.0000 mg | ORAL_TABLET | Freq: Two times a day (BID) | ORAL | 3 refills | Status: DC

## 2023-01-25 NOTE — Telephone Encounter (Signed)
Fyi jenate if he calls back   I called and left message for pt to discuss mychart message and headaches

## 2023-01-28 NOTE — Telephone Encounter (Signed)
Patient returned Arnett's phone call.

## 2023-01-29 NOTE — Telephone Encounter (Signed)
Call pt  Please review mychart message with pt I sent regarding headache 01/25/23  Please let me know how he is doing and is responses to my mychart questions

## 2023-01-30 NOTE — Telephone Encounter (Signed)
Spoke to pt and he stated that he would like you to place a referral to Dr Sherryll Burger [facility].

## 2023-01-31 ENCOUNTER — Other Ambulatory Visit: Payer: Self-pay | Admitting: Family

## 2023-01-31 ENCOUNTER — Telehealth: Payer: Self-pay

## 2023-01-31 DIAGNOSIS — G8929 Other chronic pain: Secondary | ICD-10-CM

## 2023-01-31 NOTE — Telephone Encounter (Signed)
Patient Advocate Ecounter  Received a fax from [employer] regarding Prior Authorization for Ondansetron HCl 4MG  tablets .   Authorization has been DENIED due to    Determination letter attached to patient chart

## 2023-02-04 NOTE — Telephone Encounter (Signed)
Spoke to pt and informed him that referral has been placed

## 2023-02-04 NOTE — Telephone Encounter (Signed)
Pt is aware.  

## 2023-02-14 ENCOUNTER — Ambulatory Visit: Payer: No Typology Code available for payment source | Admitting: Cardiology

## 2023-04-02 DIAGNOSIS — M25512 Pain in left shoulder: Secondary | ICD-10-CM | POA: Diagnosis not present

## 2023-04-02 DIAGNOSIS — M542 Cervicalgia: Secondary | ICD-10-CM | POA: Diagnosis not present

## 2023-04-02 DIAGNOSIS — R519 Headache, unspecified: Secondary | ICD-10-CM | POA: Diagnosis not present

## 2023-04-02 DIAGNOSIS — R42 Dizziness and giddiness: Secondary | ICD-10-CM | POA: Diagnosis not present

## 2023-04-02 DIAGNOSIS — S46912A Strain of unspecified muscle, fascia and tendon at shoulder and upper arm level, left arm, initial encounter: Secondary | ICD-10-CM | POA: Diagnosis not present

## 2023-04-04 ENCOUNTER — Other Ambulatory Visit: Payer: Self-pay

## 2023-04-04 ENCOUNTER — Telehealth: Payer: Self-pay

## 2023-04-04 DIAGNOSIS — R519 Headache, unspecified: Secondary | ICD-10-CM | POA: Diagnosis not present

## 2023-04-04 NOTE — Transitions of Care (Post Inpatient/ED Visit) (Signed)
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 Pt already has appt with [facility] neurology on 04/10/23. Pt said he has [facility] contact # if needs appt with PCP. pt is requesting refill of buspar 5 mg bid. pt said initially prescribed by online counseloir and pt said has previously discussed with Phoebe Perch FNP who pt said would refill if pt needed med. pt requesting refill buspar to [facility] [address]  sending note to Phoebe Perch FNP,    04/04/2023  Name: Paul Silva MRN: [id number] DOB: March 26, 1986  Today's TOC FU Call Status: Today's TOC FU Call Status:: Successful TOC FU Call Competed TOC FU Call Complete Date: 04/04/23  Transition Care Management Follow-up Telephone Call Date of Discharge: 04/03/23 Discharge Facility: Other ([facility]) Name of Other ([facility]) [facility] Facility: [facility] Type of Discharge: Emergency Department (Pressure in head (Primary Dx); Strain of left trapezius muscle,) Reason for ED Visit: Other: (head pressure) How have you been since you were released from the hospital?: Better (head feels better but wonders if could be migraines or chikri malformation which is where brain sits slightly lower than skull.) Any questions or concerns?: No  Items Reviewed: Did you receive and understand the discharge instructions provided?: Yes Medications obtained,verified, and reconciled?: Yes (Medications Reviewed) (pt is requesting refill of buspar 5 mg bid. pt said initially prescribed by online counseloir and  pt said has previously discussed with Phoebe Perch FNP who pt said would refill if pt needed med. pt requesting refill buspar to [facility] [address].) Any new allergies since your discharge?: No Dietary orders reviewed?:  NA Do you have support at home?: Yes People in Home: spouse Name of Support/Comfort Primary Source: [place]  Medications Reviewed Today: Medications Reviewed Today     Reviewed by [place], Jenate, CMA (Certified Medical Assistant) on 01/17/23 at 1554  Med List Status: <None>   Medication Order Taking? Sig Documenting Provider Last Dose Status Informant  buPROPion (WELLBUTRIN XL) 300 MG 24 hr tablet [id number] Yes Take 1 tablet (300 mg total) by mouth every morning. Allegra Grana, FNP Taking Active   naproxen sodium (ANAPROX) 550 MG tablet [id number] Yes Take at earliest onset of headache. May repeat dose in 12 hours if needed. Do not exceed two tablets in 24 hours. Take with food Allegra Grana, FNP Taking Active   ondansetron (ZOFRAN) 4 MG tablet [id number] Yes Take 1 tablet (4 mg total) by mouth every 8 (eight) hours as needed for nausea or vomiting. Allegra Grana, FNP Taking Active   propranolol (INDERAL) 10 MG tablet [id number] Yes Take 1 tablet (10 mg total) by mouth 2 (two) times daily. Allegra Grana, FNP Taking Active   rosuvastatin (CRESTOR) 10 MG tablet [id number] Yes Take 1 tablet (10 mg total) by mouth every evening. Allegra Grana, FNP Taking Active   traZODone (DESYREL) 50 MG tablet [id number] Yes Take 0.5 tablets (25 mg total) by mouth at bedtime. Allegra Grana, FNP Taking Active   vitamin B-12 (CYANOCOBALAMIN) 1000 MCG tablet [id number] Yes Take 1 tablet (1,000 mcg total) by mouth daily. Allegra Grana, FNP Taking Active             Home Care and Equipment/Supplies: Were Home Health Services Ordered?: NA  Any new equipment or medical supplies ordered?: NA  Functional Questionnaire: Do you need assistance with bathing/showering or dressing?: No Do you need assistance with meal preparation?: No Do you need assistance with eating?: No Do you have difficulty maintaining continence: No Do you need assistance with getting out of bed/getting out of a  chair/moving?: No Do you have difficulty managing or taking your medications?: No  Follow up appointments reviewed: PCP Follow-up appointment confirmed?: NA (pt said wanted to see neurologist first and then if FU appt with PCP needed pt has contact info for [facility] and pt will call for appt.) Specialist Hospital Follow-up appointment confirmed?: Yes Date of Specialist follow-up appointment?: 04/10/23 Follow-Up Specialty Provider:: [facility] neurology Do you need transportation to your follow-up appointment?: No Do you understand care options if your condition(s) worsen?: Yes-patient verbalized understanding    SIGNATURE Lewanda Rife, LPN

## 2023-04-04 NOTE — Telephone Encounter (Signed)
pt is requesting refill of buspar 5 mg bid while doing TOC ED FU.pt said initially prescribed by online counseloir and pt said has previously discussed with Phoebe Perch FNP who pt said would refill if pt needed med. pt requesting refill buspar to [facility] [address]

## 2023-04-05 MED ORDER — BUSPIRONE HCL 5 MG PO TABS
5.0000 mg | ORAL_TABLET | Freq: Two times a day (BID) | ORAL | 2 refills | Status: DC
Start: 2023-04-05 — End: 2023-10-22

## 2023-04-05 NOTE — Telephone Encounter (Signed)
noted 

## 2023-05-15 NOTE — Progress Notes (Deleted)
Cardiology Office Note:  .   Date:  05/15/2023  ID:  Paul Silva, DOB Jan 01, 1986, MRN [id number] PCP: Allegra Grana, FNP  [facility] Providers Cardiologist:  Bryan Lemma, MD { Click to update primary MD,subspecialty MD or APP then REFRESH:1}    No chief complaint on file.   History of Present Illness: Paul Silva is a otherwise healthy 37 y.o. male with a PMH notable for Hyperlipidemia and Borderline Hypertension with Family History of premature CAD (father MI at age 40) who presents here for *** at the request of Jason Coop, Lyn Records, FNP.  [name] was seen on October 10, 2022 for evaluation of atypical chest pain.  He was having several episodes lasting 5 to 10 minutes of chest tightness associate with a pounding sensation.  Episodes occurred randomly off and on for couple weeks.  Had some episodes after eating tacos.  Noted some exercise tolerance but having cough and congestion with allergies.  Also noted some heartburn symptoms.  Some with slight dizziness and stress headaches. = Chest pain evaluated for GXT. For Risk assessment we checked lipid panel and chemistries.  Plan was to follow-up in roughly 4 months.  We can consider assessing Coronary Calcium Score if GXT was negative versus coronary CTA if positive    Subjective   INTERVAL HISTORY   ROS:  Cardiovascular ROS: {roscv:310661} Review of Systems - {ros master:310782}     Objective   Studies Reviewed: Marland Kitchen       GXT October 30, 2022: Low risk exercise treadmill test.  Exercised 10 minutes, 11.7 METS.  LOW RISK.  Risk Assessment/Calculations:     No BP recorded.  {Refresh Note OR Click here to enter BP  :1}***         Physical Exam:   VS:  There were no vitals taken for this visit.   Wt Readings from Last 3 Encounters:  01/17/23 270 lb 3.2 oz (122.6 kg)  12/24/22 270 lb 3.2 oz (122.6 kg)  10/10/22 263 lb 3.2 oz (119.4 kg)    GEN: Well nourished, well developed in no acute  distress; *** NECK: No JVD; No carotid bruits CARDIAC: Normal S1, S2; RRR, no murmurs, rubs, gallops RESPIRATORY:  Clear to auscultation without rales, wheezing or rhonchi ; nonlabored, good air movement. ABDOMEN: Soft, non-tender, non-distended EXTREMITIES:  No edema; No deformity      ASSESSMENT AND PLAN: .    Problem List Items Addressed This Visit   None       {Are you ordering a CV Procedure (e.g. stress test, cath, DCCV, TEE, etc)?   Press F2        :[id number]}   Dispo: No follow-ups on file.  Total time spent: *** min spent with patient + *** min spent charting = *** min  Signed, Marykay Lex, MD, MS Bryan Lemma, M.D., M.S. Interventional Cardiologist  [facility]  Pager # [phone] Phone # [phone] [address] [place], [place] [address]

## 2023-05-16 ENCOUNTER — Ambulatory Visit: Payer: Self-pay | Attending: Cardiology | Admitting: Cardiology

## 2023-05-16 DIAGNOSIS — E785 Hyperlipidemia, unspecified: Secondary | ICD-10-CM

## 2023-05-16 DIAGNOSIS — Z8249 Family history of ischemic heart disease and other diseases of the circulatory system: Secondary | ICD-10-CM

## 2023-05-16 DIAGNOSIS — R0789 Other chest pain: Secondary | ICD-10-CM

## 2023-05-16 DIAGNOSIS — J849 Interstitial pulmonary disease, unspecified: Secondary | ICD-10-CM

## 2023-05-17 ENCOUNTER — Encounter: Payer: Self-pay | Admitting: Cardiology

## 2023-05-28 DIAGNOSIS — M9906 Segmental and somatic dysfunction of lower extremity: Secondary | ICD-10-CM | POA: Diagnosis not present

## 2023-05-28 DIAGNOSIS — S39012A Strain of muscle, fascia and tendon of lower back, initial encounter: Secondary | ICD-10-CM | POA: Diagnosis not present

## 2023-05-28 DIAGNOSIS — M9902 Segmental and somatic dysfunction of thoracic region: Secondary | ICD-10-CM | POA: Diagnosis not present

## 2023-05-28 DIAGNOSIS — M62830 Muscle spasm of back: Secondary | ICD-10-CM | POA: Diagnosis not present

## 2023-05-28 DIAGNOSIS — M62452 Contracture of muscle, left thigh: Secondary | ICD-10-CM | POA: Diagnosis not present

## 2023-05-28 DIAGNOSIS — M9903 Segmental and somatic dysfunction of lumbar region: Secondary | ICD-10-CM | POA: Diagnosis not present

## 2023-05-30 DIAGNOSIS — M62830 Muscle spasm of back: Secondary | ICD-10-CM | POA: Diagnosis not present

## 2023-05-30 DIAGNOSIS — M9902 Segmental and somatic dysfunction of thoracic region: Secondary | ICD-10-CM | POA: Diagnosis not present

## 2023-05-30 DIAGNOSIS — M62452 Contracture of muscle, left thigh: Secondary | ICD-10-CM | POA: Diagnosis not present

## 2023-05-30 DIAGNOSIS — M9906 Segmental and somatic dysfunction of lower extremity: Secondary | ICD-10-CM | POA: Diagnosis not present

## 2023-05-30 DIAGNOSIS — S39012A Strain of muscle, fascia and tendon of lower back, initial encounter: Secondary | ICD-10-CM | POA: Diagnosis not present

## 2023-05-30 DIAGNOSIS — M9903 Segmental and somatic dysfunction of lumbar region: Secondary | ICD-10-CM | POA: Diagnosis not present

## 2023-06-14 ENCOUNTER — Ambulatory Visit: Payer: BC Managed Care – PPO | Admitting: Nurse Practitioner

## 2023-06-14 ENCOUNTER — Encounter: Payer: Self-pay | Admitting: Nurse Practitioner

## 2023-06-14 VITALS — BP 118/68 | HR 105 | Temp 98.2°F | Ht 71.0 in | Wt 263.4 lb

## 2023-06-14 DIAGNOSIS — R051 Acute cough: Secondary | ICD-10-CM

## 2023-06-14 LAB — POCT INFLUENZA A/B
Influenza A, POC: NEGATIVE
Influenza B, POC: NEGATIVE

## 2023-06-14 LAB — POC COVID19 BINAXNOW: SARS Coronavirus 2 Ag: NEGATIVE

## 2023-06-14 MED ORDER — BENZONATATE 100 MG PO CAPS: 100.0000 mg | ORAL_CAPSULE | Freq: Three times a day (TID) | ORAL | 0 refills | Status: DC | PRN

## 2023-06-14 NOTE — Patient Instructions (Signed)
COVID and flu is negative. Advise to perform salt and warm water gargles, Increase fluid intake. Take OTC plain mucinex.

## 2023-06-14 NOTE — Assessment & Plan Note (Addendum)
POCT COVID and flu negative. Most likely viral in etiology.  Symptomatic treatment discussed. Will treat the cough with Tessalon Perles. Increase fluid intake. Advised patient to keep [place] posted about the symptoms

## 2023-06-27 DIAGNOSIS — A499 Bacterial infection, unspecified: Secondary | ICD-10-CM | POA: Diagnosis not present

## 2023-07-08 ENCOUNTER — Telehealth: Payer: Self-pay | Admitting: Cardiology

## 2023-07-08 NOTE — Telephone Encounter (Signed)
  Per MyChart scheduling message:   Initial Complaint: Random weird pain in chest on left side. Came out of nowhere while on vacation. Cause for concern because I've never experienced anything similar to this before.   Pt c/o of Chest Pain: STAT if active CP, including tightness, pressure, jaw pain, radiating pain to shoulder/upper arm/back, CP unrelieved by Nitro. Symptoms reported of SOB, nausea, vomiting, sweating.  1. Are you having CP right now?    2. Are you experiencing any other symptoms (ex. SOB, nausea, vomiting, sweating)?   3. Is your CP continuous or coming and going?   4. Have you taken Nitroglycerin?   5. How long have you been experiencing CP?    6. If NO CP at time of call then end call with telling Pt to call back or call 911 if Chest pain returns prior to return call from triage team.   No chest pain right now. No other symptoms to go ahead with the random increments of pain. I have not taken any nitroglycerin and the pain has been on and off for the past day or so.

## 2023-07-08 NOTE — Telephone Encounter (Signed)
Spoke with patient and he states he have been experiencing dull chest pain off and on for a while. Denies any SOB, swelling, headache, nausea or vomiting.  Appointment scheduled. ED precautions discussed

## 2023-07-11 ENCOUNTER — Encounter: Payer: Self-pay | Admitting: Family

## 2023-07-11 ENCOUNTER — Ambulatory Visit: Payer: BC Managed Care – PPO | Admitting: Family

## 2023-07-11 VITALS — BP 118/78 | HR 68 | Temp 97.7°F | Ht 71.0 in | Wt 263.8 lb

## 2023-07-11 DIAGNOSIS — R7309 Other abnormal glucose: Secondary | ICD-10-CM

## 2023-07-11 DIAGNOSIS — E785 Hyperlipidemia, unspecified: Secondary | ICD-10-CM

## 2023-07-11 DIAGNOSIS — F419 Anxiety disorder, unspecified: Secondary | ICD-10-CM | POA: Diagnosis not present

## 2023-07-11 DIAGNOSIS — R0789 Other chest pain: Secondary | ICD-10-CM

## 2023-07-11 DIAGNOSIS — Z8249 Family history of ischemic heart disease and other diseases of the circulatory system: Secondary | ICD-10-CM

## 2023-07-11 LAB — CBC WITH DIFFERENTIAL/PLATELET
Basophils Absolute: 0 10*3/uL (ref 0.0–0.1)
Basophils Relative: 0.8 % (ref 0.0–3.0)
Eosinophils Absolute: 0.2 10*3/uL (ref 0.0–0.7)
Eosinophils Relative: 6.6 % — ABNORMAL HIGH (ref 0.0–5.0)
HCT: 41.6 % (ref 39.0–52.0)
Hemoglobin: 13.4 g/dL (ref 13.0–17.0)
Lymphocytes Relative: 54.8 % — ABNORMAL HIGH (ref 12.0–46.0)
Lymphs Abs: 1.8 10*3/uL (ref 0.7–4.0)
MCHC: 32.2 g/dL (ref 30.0–36.0)
MCV: 79.9 fL (ref 78.0–100.0)
Monocytes Absolute: 0.3 10*3/uL (ref 0.1–1.0)
Monocytes Relative: 9.6 % (ref 3.0–12.0)
Neutro Abs: 0.9 10*3/uL — ABNORMAL LOW (ref 1.4–7.7)
Neutrophils Relative %: 28.2 % — ABNORMAL LOW (ref 43.0–77.0)
Platelets: 162 10*3/uL (ref 150.0–400.0)
RBC: 5.21 Mil/uL (ref 4.22–5.81)
RDW: 13.4 % (ref 11.5–15.5)
WBC: 3.3 10*3/uL — ABNORMAL LOW (ref 4.0–10.5)

## 2023-07-11 LAB — LIPID PANEL
Cholesterol: 153 mg/dL (ref 0–200)
HDL: 39.8 mg/dL (ref >39.00)
LDL Cholesterol: 100 mg/dL — ABNORMAL HIGH (ref 0–99)
NonHDL: 113.6
Total CHOL/HDL Ratio: 4
Triglycerides: 68 mg/dL (ref 0.0–149.0)
VLDL: 13.6 mg/dL (ref 0.0–40.0)

## 2023-07-11 LAB — COMPREHENSIVE METABOLIC PANEL
ALT: 21 U/L (ref 0–53)
AST: 15 U/L (ref 0–37)
Albumin: 4.5 g/dL (ref 3.5–5.2)
Alkaline Phosphatase: 63 U/L (ref 39–117)
BUN: 9 mg/dL (ref 6–23)
CO2: 30 meq/L (ref 19–32)
Calcium: 9.5 mg/dL (ref 8.4–10.5)
Chloride: 102 meq/L (ref 96–112)
Creatinine, Ser: 1.12 mg/dL (ref 0.40–1.50)
GFR: 85 mL/min (ref >60.00)
Glucose, Bld: 97 mg/dL (ref 70–99)
Potassium: 4.3 meq/L (ref 3.5–5.1)
Sodium: 139 meq/L (ref 135–145)
Total Bilirubin: 0.5 mg/dL (ref 0.2–1.2)
Total Protein: 6.9 g/dL (ref 6.0–8.3)

## 2023-07-11 LAB — HEMOGLOBIN A1C: Hgb A1c MFr Bld: 6 % (ref 4.6–6.5)

## 2023-07-11 MED ORDER — SERTRALINE HCL 50 MG PO TABS: 50.0000 mg | ORAL_TABLET | Freq: Every day | ORAL | 3 refills | Status: DC

## 2023-07-11 NOTE — Progress Notes (Signed)
Assessment & Plan:  Anxiety Assessment & Plan: Uncontrolled.  Discussed anxiety how it was affecting quality life, sleep.  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  Patient was agreeable to trial Zoloft 50 mg nightly.  Continue BuSpar 5 mg twice daily as needed . previously he had done counseling through work which he did not find to be particularly helpful at that time.  We may consider revisiting counseling in the future.   Orders: -     Sertraline HCl; Take 1 tablet (50 mg total) by mouth at bedtime.  Dispense: 90 tablet; Refill: 3  Hyperlipidemia, unspecified hyperlipidemia type -     Lipid panel -     Ambulatory referral to Cardiology  Elevated hemoglobin A1c -     CBC with Differential/Platelet -     Comprehensive metabolic panel -     Hemoglobin A1c  Atypical chest pain Assessment & Plan: Chest pain has resolved.  No further recurrence.  We discussed surrounding features and jointly agreed more likely to be anxiety in nature . reviewed previous evaluation by Dr. Herbie Baltimore.  Fortunately patient had a reassuring stress test 10/2022.  EKG today shows sinus bradycardia without acute changes when compared to previous 10/10/2022.  Of note, repeated EKG 3 times in office due to artifact on first rhythm strip.  Patient has been driving to [facility] to see cardiology.  He request follow-up with cardiology in [place].  I placed a referral to [facility] heart care.  Strict return precautions given to patient.  Orders: -     Ambulatory referral to Cardiology -     EKG 12-Lead  Family history of premature CAD -     Ambulatory referral to Cardiology     Return precautions given.   Risks, benefits, and alternatives of the medications and treatment plan prescribed today were discussed, and patient expressed understanding.   Education regarding symptom management and diagnosis given to patient on AVS either electronically or printed.  Return in about 6 weeks (around  08/22/2023).  Rennie Plowman, FNP  Subjective:    Patient ID: Paul Silva, male    DOB: 18-Feb-1986, 37 y.o.   MRN: [id number]  CC: Paul Silva is a 37 y.o. male who presents today for follow up.   HPI: Here today to discuss anxiety  He describes 'a panic attack' while on vacation last week.  He describes is the second "panic attack" that he has had in his life.  He traveled with his wife to the [place].   Midway through the course of this trip, he just had an excursion with riding in [employer] . He had taken a shower and was in the gift shop.  There were a lot of people and he starting to feel 'overwhelmed'. He started to breathe harder and he had left chest pain. Pain didn't radiate down his arm to to his jaw. Chest pain lasted 1-2 minutes, and then resolved without intervention.   No associated fever, cough, sob, vision changes, dizziness.     He then returned to his hotel room and took one tablet lexapro  ( old rx) in his hotel room; he felt the next day that he didn't feel emotion and had 'cotton mouth. '  He has been 'trying to wean' himself off the buspar and doesn't take often.   He is not sleeping well and feels related to anxiety. He starts thinking a 'mile a minute.  He goes to bed at 2am  and up at 7am.   He works as a Emergency planning/management officer.   No depression. Denies SI/hi  Sister and dad have anxiety.    Denies recurrence of CP. No CP today.       He is no longer following with Dr Sharen Hint through work.  He has participated in EAP through work. Counseling was not helpful.     Last seen by cardiology 10/10/12 Dr Herbie Baltimore for HLD, atypical chest pain, family h/o CAD.   Normal ECG stress test w/o evidence of ischemia 10/2022  HA's overall have improved. He describes previous migraines have resolved at this time. He will get a 'baby headache' from time to time.   Previously tried Hydroxyzine, Prozac, Lexapro, wellbutrin, propranolol.   Follow-up  scheduled Dr. Herbie Baltimore 07/15/2023  He doesnt snores   Allergies: Wellbutrin [bupropion] Current Outpatient Medications on File Prior to Visit  Medication Sig Dispense Refill   busPIRone (BUSPAR) 5 MG tablet Take 1 tablet (5 mg total) by mouth 2 (two) times daily. 180 tablet 2   rosuvastatin (CRESTOR) 10 MG tablet Take 1 tablet (10 mg total) by mouth every evening. 90 tablet 3   No current facility-administered medications on file prior to visit.    Review of Systems  Constitutional:  Negative for chills and fever.  Respiratory:  Negative for cough, chest tightness and shortness of breath.   Cardiovascular:  Negative for chest pain, palpitations and leg swelling.  Gastrointestinal:  Negative for nausea and vomiting.  Psychiatric/Behavioral:  Positive for sleep disturbance. Negative for suicidal ideas. The patient is nervous/anxious.       Objective:    BP 118/78   Pulse 68   Temp 97.7 F (36.5 C) (Oral)   Ht 5\' 11"  (1.803 m)   Wt 263 lb 12.8 oz (119.7 kg)   SpO2 99%   BMI 36.79 kg/m  BP Readings from Last 3 Encounters:  07/11/23 118/78  06/14/23 118/68  12/24/22 128/80   Wt Readings from Last 3 Encounters:  07/11/23 263 lb 12.8 oz (119.7 kg)  06/14/23 263 lb 6.4 oz (119.5 kg)  01/17/23 270 lb 3.2 oz (122.6 kg)      07/11/2023    8:25 AM 06/14/2023    9:25 AM 12/24/2022    3:19 PM  Depression screen PHQ 2/9  Decreased Interest 0 2 0  Down, Depressed, Hopeless 0 2 0  PHQ - 2 Score 0 4 0  Altered sleeping 3 2 3   Tired, decreased energy 2 2 3   Change in appetite 1 2 0  Feeling bad or failure about yourself  0 1 0  Trouble concentrating 1 2 3   Moving slowly or fidgety/restless 0 2 3  Suicidal thoughts 0 0 0  PHQ-9 Score 7 15 12   Difficult doing work/chores Somewhat difficult Not difficult at all Somewhat difficult    Physical Exam Vitals reviewed.  Constitutional:      Appearance: He is well-developed.  Cardiovascular:     Rate and Rhythm: Regular rhythm.      Heart sounds: Normal heart sounds.  Pulmonary:     Effort: Pulmonary effort is normal. No respiratory distress.     Breath sounds: Normal breath sounds. No wheezing, rhonchi or rales.  Chest:     Chest wall: No tenderness.     Comments: No reproducible chest wall pain.  No pain with movement of shoulder or internal rotation of bilateral shoulders Skin:    General: Skin is warm and dry.  Neurological:  Mental Status: He is alert.  Psychiatric:        Speech: Speech normal.        Behavior: Behavior normal.

## 2023-07-11 NOTE — Patient Instructions (Addendum)
   Trial of zoloft  I have placed a new referral to cardiology Let us know if you dont hear back within a week in regards to an appointment being scheduled.   So that you are aware, if you are [facility] MyChart user , please pay attention to your MyChart messages as you may receive a MyChart message with a phone number to call and schedule this test/appointment own your own from our referral coordinator. This is a new process so I do not want you to miss this message.  If you are not a MyChart user, you will receive a phone call.    Please stay hypervigilant and most certainly if you were to experience chest pain associated with left arm numbness, jaw pain shortness of breath or oif felt different than previous episodes when it occurred with anxiety, please do not hesitate to call 911 or report to nearest emergency room.  I want you to be safe.

## 2023-07-11 NOTE — Assessment & Plan Note (Addendum)
Uncontrolled.  Discussed anxiety how it was affecting quality life, sleep.  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  Patient was agreeable to trial Zoloft 50 mg nightly.  Continue BuSpar 5 mg twice daily as needed . previously he had done counseling through work which he did not find to be particularly helpful at that time.  We may consider revisiting counseling in the future.

## 2023-07-11 NOTE — Assessment & Plan Note (Addendum)
Chest pain has resolved.  No further recurrence.  We discussed surrounding features and jointly agreed more likely to be anxiety in nature . reviewed previous evaluation by Dr. Herbie Baltimore.  Fortunately patient had a reassuring stress test 10/2022.  EKG today shows sinus bradycardia without acute changes when compared to previous 10/10/2022.  Of note, repeated EKG 3 times in office due to artifact on first rhythm strip.  Patient has been driving to [facility] to see cardiology.  He request follow-up with cardiology in [place].  I placed a referral to [facility] heart care.  Strict return precautions given to patient.

## 2023-07-12 NOTE — Addendum Note (Signed)
Addended by: [place], Laqueshia Cihlar on: 07/12/2023 03:44 PM   Modules accepted: Orders

## 2023-07-15 ENCOUNTER — Ambulatory Visit: Payer: BC Managed Care – PPO | Attending: Cardiology | Admitting: Cardiology

## 2023-07-15 NOTE — Progress Notes (Deleted)
  Cardiology Office Note:  .   Date:  07/15/2023  ID:  Paul Silva, DOB 07/31/1986, MRN [id number] PCP: Allegra Grana, FNP  [facility] Providers Cardiologist:  Bryan Lemma, MD { Click to update primary MD,subspecialty MD or APP then REFRESH:1}    No chief complaint on file.   Patient Profile: .     Paul Silva is a morbidly obese 37 y.o. male with a PMH notable for GAD, Chronic Headaches, HLD and Family History of CAD who presents here for evaluation of recurrent chest pain at the request of Allegra Grana, FNP.     Paul Silva was seen on October 10, 2022 for evaluation of the family history of CAD.  He decided to come in for the consultation because he was having some chest discomfort.  Episodes lasting roughly 5 to 10 minutes.  Also has irregular heartbeats that he thought was related to his asthma.  We ordered a lipid and LFT panel as well as a GXT  Subjective  Discussed the use of AI scribe software for clinical note transcription with the patient, who gave verbal consent to proceed.  History of Present Illness          Cardiovascular ROS: {roscv:310661}  ROS:  Review of Systems - {ros master:310782}     Objective   Studies Reviewed: Marland Kitchen       GXT/ETT 10/30/2022: Excellent exercise capacity-10 minutes, 11.7 METS.  Normal HR and BP response.  No EKG changes-no ischemic EKG changes.  LOW RISK  Risk Assessment/Calculations:     No BP recorded.  {Refresh Note OR Click here to enter BP  :1}***        Physical Exam:   VS:  There were no vitals taken for this visit.   Wt Readings from Last 3 Encounters:  07/11/23 263 lb 12.8 oz (119.7 kg)  06/14/23 263 lb 6.4 oz (119.5 kg)  01/17/23 270 lb 3.2 oz (122.6 kg)    GEN: Well nourished, well developed in no acute distress; *** NECK: No JVD; No carotid bruits CARDIAC: Normal S1, S2; RRR, no murmurs, rubs, gallops RESPIRATORY:  Clear to auscultation without rales, wheezing or rhonchi ; nonlabored,  good air movement. ABDOMEN: Soft, non-tender, non-distended EXTREMITIES:  No edema; No deformity      ASSESSMENT AND PLAN: .    Problem List Items Addressed This Visit   None       {Are you ordering a CV Procedure (e.g. stress test, cath, DCCV, TEE, etc)?   Press F2        :[id number]}   Dispo: No follow-ups on file.  Total time spent: *** min spent with patient + *** min spent charting = *** min      Signed, Marykay Lex, MD, MS Bryan Lemma, M.D., M.S. Interventional Cardiologist  [facility]  Pager # [phone] Phone # [phone] [address] [place], [place] [address]

## 2023-07-29 ENCOUNTER — Telehealth: Payer: Self-pay | Admitting: Family

## 2023-07-29 NOTE — Telephone Encounter (Signed)
Spoke to pt and scheduled myvv for  Wed @ 12:45

## 2023-07-29 NOTE — Telephone Encounter (Signed)
Patient just called and said he has a sinus infection. And wants to see if he can get a Zpack. The pharmacy he use is [facility] [address], [place] - [address] [place] [address] Phone: [phone]  Fax: [phone]  His number is [phone]

## 2023-07-31 ENCOUNTER — Encounter: Payer: Self-pay | Admitting: Family

## 2023-07-31 ENCOUNTER — Ambulatory Visit (INDEPENDENT_AMBULATORY_CARE_PROVIDER_SITE_OTHER): Payer: BC Managed Care – PPO

## 2023-07-31 ENCOUNTER — Telehealth: Payer: BC Managed Care – PPO | Admitting: Family

## 2023-07-31 ENCOUNTER — Other Ambulatory Visit: Payer: BC Managed Care – PPO

## 2023-07-31 VITALS — Ht 71.0 in | Wt 263.1 lb

## 2023-07-31 DIAGNOSIS — J40 Bronchitis, not specified as acute or chronic: Secondary | ICD-10-CM

## 2023-07-31 DIAGNOSIS — E785 Hyperlipidemia, unspecified: Secondary | ICD-10-CM | POA: Diagnosis not present

## 2023-07-31 DIAGNOSIS — R059 Cough, unspecified: Secondary | ICD-10-CM | POA: Diagnosis not present

## 2023-07-31 MED ORDER — AZITHROMYCIN 250 MG PO TABS
ORAL_TABLET | ORAL | 0 refills | Status: AC
Start: 2023-07-31 — End: 2023-08-05

## 2023-07-31 MED ORDER — BUDESONIDE-FORMOTEROL FUMARATE 80-4.5 MCG/ACT IN AERO
2.0000 | INHALATION_SPRAY | Freq: Two times a day (BID) | RESPIRATORY_TRACT | 2 refills | Status: AC
Start: 2023-07-31 — End: ?

## 2023-07-31 MED ORDER — ALBUTEROL SULFATE HFA 108 (90 BASE) MCG/ACT IN AERS
2.0000 | INHALATION_SPRAY | Freq: Four times a day (QID) | RESPIRATORY_TRACT | 1 refills | Status: AC | PRN
Start: 2023-07-31 — End: ?

## 2023-07-31 MED ORDER — ROSUVASTATIN CALCIUM 10 MG PO TABS
10.0000 mg | ORAL_TABLET | Freq: Every evening | ORAL | 3 refills | Status: DC
Start: 2023-07-31 — End: 2024-05-20

## 2023-07-31 NOTE — Progress Notes (Signed)
Virtual Visit via Video Note  I connected with Paul Silva on 08/02/23 at 12:45 PM EST by a video enabled telemedicine application and verified that I am speaking with the correct person using two identifiers. Location patient: home Location provider: work  Persons participating in the virtual visit: patient, provider  I discussed the limitations of evaluation and management by telemedicine and the availability of in person appointments. The patient expressed understanding and agreed to proceed.  HPI: Complains of cough, waxing and waning, started 4 weeks ago.   He complains of cough, worse at night. Mucous is brown and yellow.   He will have coughing spell and wheezing at bedtime.   He has been taking mucinex, vapo cool,  and zyrtec.    He is using albuterol at night with relief.   No fever, chills, SOB, CP.   Previously on Symbicort.    ROS: See pertinent positives and negatives per HPI.  EXAM:  VITALS per patient if applicable: Ht 5\' 11"  (1.803 m)   Wt 263 lb 1.6 oz (119.3 kg)   BMI 36.70 kg/m  BP Readings from Last 3 Encounters:  07/11/23 118/78  06/14/23 118/68  12/24/22 128/80   Wt Readings from Last 3 Encounters:  07/31/23 263 lb 1.6 oz (119.3 kg)  07/11/23 263 lb 12.8 oz (119.7 kg)  06/14/23 263 lb 6.4 oz (119.5 kg)  OBJECTIVE:   GENERAL: alert, oriented, appears well and in no acute distress  HEENT: atraumatic, conjunttiva clear, no obvious abnormalities on inspection of external nose and ears  NECK: normal movements of the head and neck  LUNGS: on inspection no signs of respiratory distress, breathing rate appears normal, no obvious gross SOB, gasping or wheezing  CV: no obvious cyanosis  MS: moves all visible extremities without noticeable abnormality  PSYCH/NEURO: pleasant and cooperative, no obvious depression or anxiety, speech and thought processing grossly intact  ASSESSMENT AND PLAN: Bronchitis Assessment & Plan: Afebrile.  No acute  respiratory distress.  Patient did not complete pulmonology workup in 2018 including serum ACE, CT high-resolution.  I have sent MyChart message to patient after extensive chart review recommending completion of workup.  Fortunately chest x-ray does not reveal an acute pneumonia at this time.  Advised to complete azithromycin.  I have refilled Symbicort.  He will let me know how is doing.   Orders: -     Albuterol Sulfate HFA; Inhale 2 puffs into the lungs every 6 (six) hours as needed for wheezing or shortness of breath.  Dispense: 8 g; Refill: 1 -     Azithromycin; Take 2 tablets on day 1, then 1 tablet daily on days 2 through 5  Dispense: 6 tablet; Refill: 0 -     DG Chest 2 View; Future -     Budesonide-Formoterol Fumarate; Inhale 2 puffs into the lungs 2 (two) times daily.  Dispense: 1 each; Refill: 2  Hyperlipidemia, unspecified hyperlipidemia type -     Rosuvastatin Calcium; Take 1 tablet (10 mg total) by mouth every evening.  Dispense: 90 tablet; Refill: 3     -we discussed possible serious and likely etiologies, options for evaluation and workup, limitations of telemedicine visit vs in person visit, treatment, treatment risks and precautions. Pt prefers to treat via telemedicine empirically rather then risking or undertaking an in person visit at this moment.    I discussed the assessment and treatment plan with the patient. The patient was provided an opportunity to ask questions and all were answered. The patient  agreed with the plan and demonstrated an understanding of the instructions.   The patient was advised to call back or seek an in-person evaluation if the symptoms worsen or if the condition fails to improve as anticipated.  Advised if desired AVS can be mailed or viewed via MyChart if Mychart user.   Rennie Plowman, FNP

## 2023-08-02 ENCOUNTER — Other Ambulatory Visit: Payer: Self-pay | Admitting: Family

## 2023-08-02 ENCOUNTER — Encounter: Payer: Self-pay | Admitting: Family

## 2023-08-02 NOTE — Patient Instructions (Signed)
Please complete azithromycin.  I have refilled Symbicort as well as your inhaler.

## 2023-08-02 NOTE — Assessment & Plan Note (Signed)
Afebrile.  No acute respiratory distress.  Patient did not complete pulmonology workup in 2018 including serum ACE, CT high-resolution.  I have sent MyChart message to patient after extensive chart review recommending completion of workup.  Fortunately chest x-ray does not reveal an acute pneumonia at this time.  Advised to complete azithromycin.  I have refilled Symbicort.  He will let me know how is doing.

## 2023-08-19 ENCOUNTER — Ambulatory Visit: Payer: BC Managed Care – PPO | Admitting: Family

## 2023-09-12 ENCOUNTER — Ambulatory Visit: Payer: BC Managed Care – PPO | Admitting: Family

## 2023-10-03 ENCOUNTER — Ambulatory Visit: Payer: BC Managed Care – PPO | Admitting: Family

## 2023-10-22 ENCOUNTER — Ambulatory Visit: Payer: BC Managed Care – PPO | Admitting: Family

## 2023-10-22 ENCOUNTER — Encounter: Payer: Self-pay | Admitting: Family

## 2023-10-22 VITALS — BP 128/84 | HR 88 | Temp 97.8°F | Ht 71.0 in | Wt 274.8 lb

## 2023-10-22 DIAGNOSIS — F419 Anxiety disorder, unspecified: Secondary | ICD-10-CM | POA: Diagnosis not present

## 2023-10-22 DIAGNOSIS — E785 Hyperlipidemia, unspecified: Secondary | ICD-10-CM

## 2023-10-22 LAB — CBC WITH DIFFERENTIAL/PLATELET
Basophils Absolute: 0 10*3/uL (ref 0.0–0.1)
Basophils Relative: 0.7 % (ref 0.0–3.0)
Eosinophils Absolute: 0.2 10*3/uL (ref 0.0–0.7)
Eosinophils Relative: 5.9 % — ABNORMAL HIGH (ref 0.0–5.0)
HCT: 41.6 % (ref 39.0–52.0)
Hemoglobin: 13.6 g/dL (ref 13.0–17.0)
Lymphocytes Relative: 42.3 % (ref 12.0–46.0)
Lymphs Abs: 1.5 10*3/uL (ref 0.7–4.0)
MCHC: 32.6 g/dL (ref 30.0–36.0)
MCV: 80.2 fL (ref 78.0–100.0)
Monocytes Absolute: 0.3 10*3/uL (ref 0.1–1.0)
Monocytes Relative: 9.6 % (ref 3.0–12.0)
Neutro Abs: 1.4 10*3/uL (ref 1.4–7.7)
Neutrophils Relative %: 41.5 % — ABNORMAL LOW (ref 43.0–77.0)
Platelets: 165 10*3/uL (ref 150.0–400.0)
RBC: 5.19 Mil/uL (ref 4.22–5.81)
RDW: 13.9 % (ref 11.5–15.5)
WBC: 3.4 10*3/uL — ABNORMAL LOW (ref 4.0–10.5)

## 2023-10-22 MED ORDER — TRAZODONE HCL 50 MG PO TABS: 25.0000 mg | ORAL_TABLET | Freq: Every day | ORAL | 3 refills | Status: DC

## 2023-10-22 MED ORDER — SERTRALINE HCL 100 MG PO TABS: 100.0000 mg | ORAL_TABLET | Freq: Every day | ORAL | 3 refills | Status: DC

## 2023-10-22 MED ORDER — BUSPIRONE HCL 5 MG PO TABS: 5.0000 mg | ORAL_TABLET | Freq: Two times a day (BID) | ORAL | 2 refills | Status: AC

## 2023-10-22 NOTE — Patient Instructions (Addendum)
Increase zoloft 100mg    Resume trazodone.    Take 0.5 to 5mg  melatonin at 7pm with dinner -this is when natural melatonin will start to increase You may also trial melatonin combination over the counter supplement such as Sleep#3 or Qunol Sleep 5 in 1  Please let me know how you are doing.

## 2023-10-22 NOTE — Assessment & Plan Note (Addendum)
Uncontrolled.  Increase zoloft to 100mg  at bedtime. Resume trazodone 25-50mg  at bedtime.  Continue buspar 5mg  bid prn. Discussed trial of melatonin and melatonin combination OTC supplements.

## 2023-10-22 NOTE — Progress Notes (Signed)
Assessment & Plan:  Anxiety Assessment & Plan: Uncontrolled.  Increase zoloft to 100mg  at bedtime. Resume trazodone 25-50mg  at bedtime.  Continue buspar 5mg  bid prn. Discussed trial of melatonin and melatonin combination OTC supplements.   Orders: -     Sertraline HCl; Take 1 tablet (100 mg total) by mouth at bedtime.  Dispense: 90 tablet; Refill: 3 -     traZODone HCl; Take 0.5-1 tablets (25-50 mg total) by mouth at bedtime.  Dispense: 90 tablet; Refill: 3  Hyperlipidemia, unspecified hyperlipidemia type -     CBC with Differential/Platelet  Other orders -     busPIRone HCl; Take 1 tablet (5 mg total) by mouth 2 (two) times daily.  Dispense: 180 tablet; Refill: 2     Return precautions given.   Risks, benefits, and alternatives of the medications and treatment plan prescribed today were discussed, and patient expressed understanding.   Education regarding symptom management and diagnosis given to patient on AVS either electronically or printed.  No follow-ups on file.  Rennie Plowman, FNP  Subjective:    Patient ID: Paul Silva, male    DOB: Aug 29, 1986, 38 y.o.   MRN: [id number]  CC: Paul Silva is a 38 y.o. male who presents today for follow up.   HPI: Complains of increased anxiety and trouble falling asleep.   He is worrying about his family, parents at night.   Tried counseling, journaling in the past which was not effective.   He had previously been on trazodone.    He will take buspar 10mg  prior to sleep nightly which is somewhat helpful  Compliant with zoloft 50mg   Going to gym regularly and eating less fast food.   Works as Emergency planning/management officer, day shift.   Allergies: Wellbutrin [bupropion] Current Outpatient Medications on File Prior to Visit  Medication Sig Dispense Refill   albuterol (VENTOLIN HFA) 108 (90 Base) MCG/ACT inhaler Inhale 2 puffs into the lungs every 6 (six) hours as needed for wheezing or shortness of breath. 8 g 1    budesonide-formoterol (SYMBICORT) 80-4.5 MCG/ACT inhaler Inhale 2 puffs into the lungs 2 (two) times daily. 1 each 2   rosuvastatin (CRESTOR) 10 MG tablet Take 1 tablet (10 mg total) by mouth every evening. 90 tablet 3   No current facility-administered medications on file prior to visit.    Review of Systems  Constitutional:  Negative for chills and fever.  Respiratory:  Negative for cough.   Cardiovascular:  Negative for chest pain and palpitations.  Gastrointestinal:  Negative for nausea and vomiting.  Psychiatric/Behavioral:  Positive for sleep disturbance. Negative for suicidal ideas. The patient is nervous/anxious.       Objective:    BP 128/84   Pulse 88   Temp 97.8 F (36.6 C) (Oral)   Ht 5\' 11"  (1.803 m)   Wt 274 lb 12.8 oz (124.6 kg)   SpO2 99%   BMI 38.33 kg/m  BP Readings from Last 3 Encounters:  10/22/23 128/84  07/11/23 118/78  06/14/23 118/68   Wt Readings from Last 3 Encounters:  10/22/23 274 lb 12.8 oz (124.6 kg)  07/31/23 263 lb 1.6 oz (119.3 kg)  07/11/23 263 lb 12.8 oz (119.7 kg)      10/22/2023    9:34 AM 07/31/2023   12:42 PM 07/31/2023   12:37 PM  Depression screen PHQ 2/9  Decreased Interest 0 0 0  Down, Depressed, Hopeless 0 0 0  PHQ - 2 Score 0 0 0  Altered sleeping  3 2 0  Tired, decreased energy 3 0 0  Change in appetite 0 0 0  Feeling bad or failure about yourself  0 0 0  Trouble concentrating 3 0 0  Moving slowly or fidgety/restless 3 0 0  Suicidal thoughts 0 0 0  PHQ-9 Score 12 2 0  Difficult doing work/chores Somewhat difficult Not difficult at all Not difficult at all     Physical Exam Vitals reviewed.  Constitutional:      Appearance: He is well-developed.  Cardiovascular:     Rate and Rhythm: Regular rhythm.     Heart sounds: Normal heart sounds.  Pulmonary:     Effort: Pulmonary effort is normal. No respiratory distress.     Breath sounds: Normal breath sounds. No wheezing, rhonchi or rales.  Skin:    General: Skin is  warm and dry.  Neurological:     Mental Status: He is alert.  Psychiatric:        Speech: Speech normal.        Behavior: Behavior normal.

## 2023-10-29 ENCOUNTER — Encounter: Payer: Self-pay | Admitting: Family

## 2024-05-19 ENCOUNTER — Other Ambulatory Visit: Payer: Self-pay

## 2024-05-19 NOTE — Telephone Encounter (Unsigned)
 Copied from CRM [id number]. Topic: Clinical - Prescription Issue >> May 19, 2024  1:14 PM [place] J wrote: Reason for CRM: Patient is returning a missed call from [place]. She wanted to let her know that he prefers [employer] as for his medication.  [employer] - [facility] - [place], [place] - [address] [place] [address] Phone: [phone] Fax: [phone] Hours: Open 24 hours

## 2024-05-19 NOTE — Telephone Encounter (Signed)
 Called pt and the number is not a working number. I have received a refill request, and need to know which pharmacy he used.

## 2024-05-19 NOTE — Telephone Encounter (Signed)
 Noted

## 2024-05-20 ENCOUNTER — Other Ambulatory Visit: Payer: Self-pay

## 2024-05-20 DIAGNOSIS — E785 Hyperlipidemia, unspecified: Secondary | ICD-10-CM

## 2024-05-20 MED ORDER — ROSUVASTATIN CALCIUM 10 MG PO TABS: 10.0000 mg | ORAL_TABLET | Freq: Every evening | ORAL | 3 refills | Status: AC

## 2024-07-01 ENCOUNTER — Other Ambulatory Visit: Payer: Self-pay

## 2024-07-01 ENCOUNTER — Other Ambulatory Visit: Payer: Self-pay | Admitting: Family

## 2024-07-01 DIAGNOSIS — E785 Hyperlipidemia, unspecified: Secondary | ICD-10-CM

## 2024-07-01 DIAGNOSIS — F419 Anxiety disorder, unspecified: Secondary | ICD-10-CM

## 2024-07-01 DIAGNOSIS — R7309 Other abnormal glucose: Secondary | ICD-10-CM

## 2024-07-01 MED ORDER — SERTRALINE HCL 100 MG PO TABS
100.0000 mg | ORAL_TABLET | Freq: Every day | ORAL | 1 refills | Status: AC
Start: 2024-07-01 — End: ?

## 2024-08-05 DIAGNOSIS — M9903 Segmental and somatic dysfunction of lumbar region: Secondary | ICD-10-CM | POA: Diagnosis not present

## 2024-08-05 DIAGNOSIS — R293 Abnormal posture: Secondary | ICD-10-CM | POA: Diagnosis not present

## 2024-08-05 DIAGNOSIS — M62830 Muscle spasm of back: Secondary | ICD-10-CM | POA: Diagnosis not present

## 2024-08-05 DIAGNOSIS — S39012A Strain of muscle, fascia and tendon of lower back, initial encounter: Secondary | ICD-10-CM | POA: Diagnosis not present

## 2024-08-07 DIAGNOSIS — M62830 Muscle spasm of back: Secondary | ICD-10-CM | POA: Diagnosis not present

## 2024-08-07 DIAGNOSIS — R293 Abnormal posture: Secondary | ICD-10-CM | POA: Diagnosis not present

## 2024-08-07 DIAGNOSIS — M9903 Segmental and somatic dysfunction of lumbar region: Secondary | ICD-10-CM | POA: Diagnosis not present

## 2024-08-07 DIAGNOSIS — S39012A Strain of muscle, fascia and tendon of lower back, initial encounter: Secondary | ICD-10-CM | POA: Diagnosis not present

## 2024-08-11 DIAGNOSIS — J069 Acute upper respiratory infection, unspecified: Secondary | ICD-10-CM | POA: Diagnosis not present

## 2024-10-12 ENCOUNTER — Ambulatory Visit: Payer: Self-pay | Admitting: Family

## 2024-10-12 ENCOUNTER — Other Ambulatory Visit

## 2024-10-12 DIAGNOSIS — D72819 Decreased white blood cell count, unspecified: Secondary | ICD-10-CM | POA: Insufficient documentation

## 2024-10-12 DIAGNOSIS — E785 Hyperlipidemia, unspecified: Secondary | ICD-10-CM

## 2024-10-12 LAB — CBC WITH DIFFERENTIAL/PLATELET
Basophils Absolute: 0 K/uL (ref 0.0–0.1)
Basophils Relative: 0.5 % (ref 0.0–3.0)
Eosinophils Absolute: 0.1 K/uL (ref 0.0–0.7)
Eosinophils Relative: 4.8 % (ref 0.0–5.0)
HCT: 40.3 % (ref 39.0–52.0)
Hemoglobin: 13.5 g/dL (ref 13.0–17.0)
Lymphocytes Relative: 46.8 % — ABNORMAL HIGH (ref 12.0–46.0)
Lymphs Abs: 1.3 K/uL (ref 0.7–4.0)
MCHC: 33.5 g/dL (ref 30.0–36.0)
MCV: 78.9 fl (ref 78.0–100.0)
Monocytes Absolute: 0.2 K/uL (ref 0.1–1.0)
Monocytes Relative: 8.8 % (ref 3.0–12.0)
Neutro Abs: 1.1 K/uL — ABNORMAL LOW (ref 1.4–7.7)
Neutrophils Relative %: 39.1 % — ABNORMAL LOW (ref 43.0–77.0)
Platelets: 152 K/uL (ref 150.0–400.0)
RBC: 5.11 Mil/uL (ref 4.22–5.81)
RDW: 13.8 % (ref 11.5–15.5)
WBC: 2.8 K/uL — ABNORMAL LOW (ref 4.0–10.5)

## 2024-10-21 ENCOUNTER — Other Ambulatory Visit: Payer: Self-pay

## 2024-10-21 ENCOUNTER — Encounter: Admitting: Family

## 2024-10-21 DIAGNOSIS — R0789 Other chest pain: Secondary | ICD-10-CM

## 2024-10-21 DIAGNOSIS — R7303 Prediabetes: Secondary | ICD-10-CM

## 2024-10-21 DIAGNOSIS — E785 Hyperlipidemia, unspecified: Secondary | ICD-10-CM

## 2024-10-21 DIAGNOSIS — R7309 Other abnormal glucose: Secondary | ICD-10-CM

## 2024-10-21 DIAGNOSIS — Z8249 Family history of ischemic heart disease and other diseases of the circulatory system: Secondary | ICD-10-CM

## 2024-10-22 ENCOUNTER — Other Ambulatory Visit

## 2024-10-26 ENCOUNTER — Encounter: Admitting: Family

## 2024-10-29 ENCOUNTER — Other Ambulatory Visit

## 2024-10-29 DIAGNOSIS — E785 Hyperlipidemia, unspecified: Secondary | ICD-10-CM

## 2024-10-29 DIAGNOSIS — R7309 Other abnormal glucose: Secondary | ICD-10-CM

## 2024-10-29 DIAGNOSIS — R7303 Prediabetes: Secondary | ICD-10-CM

## 2024-10-29 DIAGNOSIS — Z8249 Family history of ischemic heart disease and other diseases of the circulatory system: Secondary | ICD-10-CM

## 2024-10-29 DIAGNOSIS — R0789 Other chest pain: Secondary | ICD-10-CM

## 2024-10-29 LAB — COMPREHENSIVE METABOLIC PANEL WITH GFR
ALT: 26 U/L (ref 3–53)
AST: 20 U/L (ref 5–37)
Albumin: 4.6 g/dL (ref 3.5–5.2)
Alkaline Phosphatase: 51 U/L (ref 39–117)
BUN: 11 mg/dL (ref 6–23)
CO2: 27 meq/L (ref 19–32)
Calcium: 9.3 mg/dL (ref 8.4–10.5)
Chloride: 103 meq/L (ref 96–112)
Creatinine, Ser: 1.2 mg/dL (ref 0.40–1.50)
GFR: 77.53 mL/min
Glucose, Bld: 96 mg/dL (ref 70–99)
Potassium: 3.9 meq/L (ref 3.5–5.1)
Sodium: 137 meq/L (ref 135–145)
Total Bilirubin: 0.9 mg/dL (ref 0.2–1.2)
Total Protein: 7.2 g/dL (ref 6.0–8.3)

## 2024-10-29 LAB — CBC WITH DIFFERENTIAL/PLATELET
Basophils Absolute: 0 10*3/uL (ref 0.0–0.1)
Basophils Relative: 0.7 % (ref 0.0–3.0)
Eosinophils Absolute: 0.2 10*3/uL (ref 0.0–0.7)
Eosinophils Relative: 5 % (ref 0.0–5.0)
HCT: 39.5 % (ref 39.0–52.0)
Hemoglobin: 13.1 g/dL (ref 13.0–17.0)
Lymphocytes Relative: 44 % (ref 12.0–46.0)
Lymphs Abs: 1.5 10*3/uL (ref 0.7–4.0)
MCHC: 33.2 g/dL (ref 30.0–36.0)
MCV: 79.4 fl (ref 78.0–100.0)
Monocytes Absolute: 0.3 10*3/uL (ref 0.1–1.0)
Monocytes Relative: 8.7 % (ref 3.0–12.0)
Neutro Abs: 1.5 10*3/uL (ref 1.4–7.7)
Neutrophils Relative %: 41.6 % — ABNORMAL LOW (ref 43.0–77.0)
Platelets: 144 10*3/uL — ABNORMAL LOW (ref 150.0–400.0)
RBC: 4.97 Mil/uL (ref 4.22–5.81)
RDW: 13.7 % (ref 11.5–15.5)
WBC: 3.5 10*3/uL — ABNORMAL LOW (ref 4.0–10.5)

## 2024-10-29 LAB — LIPID PANEL
Cholesterol: 157 mg/dL (ref 28–200)
HDL: 39.3 mg/dL
LDL Cholesterol: 102 mg/dL — ABNORMAL HIGH (ref 10–99)
NonHDL: 117.38
Total CHOL/HDL Ratio: 4
Triglycerides: 75 mg/dL (ref 10.0–149.0)
VLDL: 15 mg/dL (ref 0.0–40.0)

## 2024-10-29 LAB — HEMOGLOBIN A1C: Hgb A1c MFr Bld: 6.1 % (ref 4.6–6.5)

## 2024-11-17 ENCOUNTER — Encounter: Admitting: Family
